# Patient Record
Sex: Female | Born: 1970 | Race: Black or African American | Hispanic: No | Smoking: Never smoker
Health system: Southern US, Community
[De-identification: ages and names within clinical notes are randomized; demographics above are authoritative.]

## PROBLEM LIST (undated history)

## (undated) DIAGNOSIS — N939 Abnormal uterine and vaginal bleeding, unspecified: Secondary | ICD-10-CM

## (undated) DIAGNOSIS — D259 Leiomyoma of uterus, unspecified: Secondary | ICD-10-CM

## (undated) HISTORY — PX: APPENDECTOMY: SHX54

## (undated) HISTORY — PX: TUBAL LIGATION: SHX77

---

## 1999-12-05 ENCOUNTER — Ambulatory Visit (HOSPITAL_COMMUNITY): Admission: RE | Admit: 1999-12-05 | Discharge: 1999-12-05 | Payer: Self-pay | Admitting: *Deleted

## 2000-03-13 ENCOUNTER — Encounter: Admission: RE | Admit: 2000-03-13 | Discharge: 2000-03-13 | Payer: Self-pay | Admitting: Obstetrics

## 2000-04-03 ENCOUNTER — Inpatient Hospital Stay (HOSPITAL_COMMUNITY): Admission: AD | Admit: 2000-04-03 | Discharge: 2000-04-03 | Payer: Self-pay | Admitting: Obstetrics

## 2000-04-03 ENCOUNTER — Encounter: Payer: Self-pay | Admitting: Obstetrics

## 2000-04-08 ENCOUNTER — Encounter (HOSPITAL_COMMUNITY): Admission: RE | Admit: 2000-04-08 | Discharge: 2000-04-12 | Payer: Self-pay | Admitting: Obstetrics & Gynecology

## 2000-04-13 ENCOUNTER — Inpatient Hospital Stay (HOSPITAL_COMMUNITY): Admission: AD | Admit: 2000-04-13 | Discharge: 2000-04-15 | Payer: Self-pay | Admitting: Obstetrics

## 2000-04-13 ENCOUNTER — Encounter (INDEPENDENT_AMBULATORY_CARE_PROVIDER_SITE_OTHER): Payer: Self-pay | Admitting: Specialist

## 2002-08-24 ENCOUNTER — Emergency Department (HOSPITAL_COMMUNITY): Admission: EM | Admit: 2002-08-24 | Discharge: 2002-08-24 | Payer: Self-pay

## 2006-04-28 ENCOUNTER — Emergency Department (HOSPITAL_COMMUNITY): Admission: EM | Admit: 2006-04-28 | Discharge: 2006-04-28 | Payer: Self-pay | Admitting: Emergency Medicine

## 2006-07-18 ENCOUNTER — Emergency Department (HOSPITAL_COMMUNITY): Admission: EM | Admit: 2006-07-18 | Discharge: 2006-07-18 | Payer: Self-pay | Admitting: Emergency Medicine

## 2007-02-23 ENCOUNTER — Emergency Department (HOSPITAL_COMMUNITY): Admission: EM | Admit: 2007-02-23 | Discharge: 2007-02-23 | Payer: Self-pay | Admitting: Emergency Medicine

## 2008-06-11 ENCOUNTER — Emergency Department (HOSPITAL_COMMUNITY): Admission: EM | Admit: 2008-06-11 | Discharge: 2008-06-12 | Payer: Self-pay | Admitting: Emergency Medicine

## 2008-10-16 ENCOUNTER — Emergency Department (HOSPITAL_COMMUNITY): Admission: EM | Admit: 2008-10-16 | Discharge: 2008-10-16 | Payer: Self-pay | Admitting: Emergency Medicine

## 2010-06-19 ENCOUNTER — Encounter: Payer: Self-pay | Admitting: Internal Medicine

## 2010-09-11 LAB — CARBOXYHEMOGLOBIN
Carboxyhemoglobin: 0.8 % (ref 0.5–1.5)
Methemoglobin: 1 % (ref 0.0–1.5)
O2 Saturation: 43.3 %
Total hemoglobin: 9 g/dL — ABNORMAL LOW (ref 12.5–16.0)

## 2010-10-13 NOTE — Op Note (Signed)
[facility]  Patient:    Jacqueline Stanley, Jacqueline Stanley                     MRN: [id number] Proc. Date: 04/14/00 Adm. Date:  [id number] Attending:  Antionette Char                           Operative Report  PREOPERATIVE DIAGNOSIS:       Multiparity, desires permanent sterilization.  POSTOPERATIVE DIAGNOSIS:      Multiparity, desires permanent sterilization.  PROCEDURE:                    Postpartum tubal ligation, modified Pomeroy method.  SURGEON:                      Roseanna Rainbow, M.D.  ANESTHESIA:                   General endotracheal.  COMPLICATIONS:                None.  ESTIMATED BLOOD LOSS:         Less than 30 cc.  FLUIDS:                       Lactated Ringers 500 cc.  INDICATIONS:                  The patient is a 40 year old para 3 status post NSVD who desires permanent sterilization.  The risks, benefits of the procedure were discussed with the patient including the risk of failure of 4-8 per 1000 with an increased risk of an ectopic gestation.  FINDINGS:                     Normal uterus, tubes, and ovaries.  PROCEDURE:                    The patient was taken to the operating room where general anesthesia was induced without difficulty.  A small transverse infraumbilical skin incision was then made with the scalpel.  The incision was carried down through the underlying fascia until the peritoneum was identified and entered.  The peritoneum was noted to be free of any adhesions and the incision was extended with Metzenbaum scissors.  The patients left fallopian tube was then identified, brought to the incision, and grasped with a Babcock clamp.  The tube was then followed out to the fimbria.  The Babcock clamp was then used to grasp the tube approximately 4 cm from the cornual region.  A 3 cm segment of tube was then doubly ligated with free ties with plain gut and excised.  Excellent hemostasis was noted and the tube returned  to the abdomen. The right fallopian tube was manipulated in a similar fashion.  The peritoneum and fascia were then closed in a single layer using 0 Vicryl.  The skin was closed in a subcuticular fashion using 3-0 Vicryl.  The patient tolerated the procedure well.  Sponge, lap, and needle counts were correct x 2.  The patient was taken to the PACU in stable condition.  PATHOLOGY:                    Segments of right and left fallopian tubes. DD:  04/14/00 TD:  04/14/00 Job: [id number]

## 2017-05-30 ENCOUNTER — Other Ambulatory Visit: Payer: Self-pay

## 2017-05-30 ENCOUNTER — Encounter (HOSPITAL_COMMUNITY): Payer: Self-pay | Admitting: Emergency Medicine

## 2017-05-30 ENCOUNTER — Encounter (HOSPITAL_COMMUNITY): Payer: Self-pay

## 2017-05-30 ENCOUNTER — Observation Stay (HOSPITAL_COMMUNITY)
Admission: EM | Admit: 2017-05-30 | Discharge: 2017-05-31 | Disposition: A | Discharge status: Home / Self Care | Payer: Medicaid Other | Attending: Internal Medicine | Admitting: Internal Medicine

## 2017-05-30 ENCOUNTER — Observation Stay (HOSPITAL_COMMUNITY): Payer: Medicaid Other

## 2017-05-30 ENCOUNTER — Ambulatory Visit (HOSPITAL_COMMUNITY)
Admission: EM | Admit: 2017-05-30 | Discharge: 2017-05-30 | Disposition: A | Discharge status: Nursing Home (Includes ICF) | Payer: Self-pay | Attending: Internal Medicine | Admitting: Internal Medicine

## 2017-05-30 DIAGNOSIS — R52 Pain, unspecified: Secondary | ICD-10-CM

## 2017-05-30 DIAGNOSIS — D509 Iron deficiency anemia, unspecified: Principal | ICD-10-CM | POA: Insufficient documentation

## 2017-05-30 DIAGNOSIS — R9389 Abnormal findings on diagnostic imaging of other specified body structures: Secondary | ICD-10-CM | POA: Diagnosis not present

## 2017-05-30 DIAGNOSIS — D649 Anemia, unspecified: Secondary | ICD-10-CM | POA: Diagnosis present

## 2017-05-30 DIAGNOSIS — N921 Excessive and frequent menstruation with irregular cycle: Secondary | ICD-10-CM | POA: Diagnosis not present

## 2017-05-30 DIAGNOSIS — N938 Other specified abnormal uterine and vaginal bleeding: Secondary | ICD-10-CM

## 2017-05-30 DIAGNOSIS — R231 Pallor: Secondary | ICD-10-CM

## 2017-05-30 DIAGNOSIS — R42 Dizziness and giddiness: Secondary | ICD-10-CM | POA: Insufficient documentation

## 2017-05-30 DIAGNOSIS — D251 Intramural leiomyoma of uterus: Secondary | ICD-10-CM | POA: Diagnosis not present

## 2017-05-30 DIAGNOSIS — R531 Weakness: Secondary | ICD-10-CM | POA: Insufficient documentation

## 2017-05-30 HISTORY — DX: Other specified abnormal uterine and vaginal bleeding: N93.8

## 2017-05-30 LAB — COMPREHENSIVE METABOLIC PANEL
ALBUMIN: 3.6 g/dL (ref 3.5–5.0)
ALK PHOS: 43 U/L (ref 38–126)
ALT: 7 U/L — ABNORMAL LOW (ref 14–54)
ANION GAP: 7 (ref 5–15)
AST: 16 U/L (ref 15–41)
BUN: 5 mg/dL — ABNORMAL LOW (ref 6–20)
CO2: 24 mmol/L (ref 22–32)
Calcium: 8.6 mg/dL — ABNORMAL LOW (ref 8.9–10.3)
Chloride: 105 mmol/L (ref 101–111)
Creatinine, Ser: 0.53 mg/dL (ref 0.44–1.00)
GFR calc Af Amer: >60 mL/min (ref >60)
GFR calc non Af Amer: >60 mL/min (ref >60)
GLUCOSE: 97 mg/dL (ref 65–99)
POTASSIUM: 3.5 mmol/L (ref 3.5–5.1)
SODIUM: 136 mmol/L (ref 135–145)
Total Bilirubin: 0.3 mg/dL (ref 0.3–1.2)
Total Protein: 7.2 g/dL (ref 6.5–8.1)

## 2017-05-30 LAB — PREPARE RBC (CROSSMATCH)

## 2017-05-30 LAB — CBC
HCT: 11.2 % — ABNORMAL LOW (ref 36.0–46.0)
Hemoglobin: 2.8 g/dL — CL (ref 12.0–15.0)
MCH: 14.7 pg — ABNORMAL LOW (ref 26.0–34.0)
MCHC: 25 g/dL — AB (ref 30.0–36.0)
MCV: 58.6 fL — ABNORMAL LOW (ref 78.0–100.0)
PLATELETS: 502 10*3/uL — AB (ref 150–400)
RBC: 1.91 MIL/uL — ABNORMAL LOW (ref 3.87–5.11)
RDW: 20.8 % — AB (ref 11.5–15.5)
WBC: 6.3 10*3/uL (ref 4.0–10.5)

## 2017-05-30 LAB — IRON AND TIBC: TIBC: 505 ug/dL — AB (ref 250–450)

## 2017-05-30 LAB — PROTIME-INR
INR: 1.11
PROTHROMBIN TIME: 14.2 s (ref 11.4–15.2)

## 2017-05-30 LAB — FERRITIN

## 2017-05-30 LAB — APTT: aPTT: 20 seconds — ABNORMAL LOW (ref 24–36)

## 2017-05-30 LAB — ABO/RH: ABO/RH(D): B POS

## 2017-05-30 LAB — POC OCCULT BLOOD, ED: Fecal Occult Bld: NEGATIVE

## 2017-05-30 MED ORDER — DOCUSATE SODIUM 100 MG PO CAPS: 100.0000 mg | ORAL_CAPSULE | Freq: Two times a day (BID) | ORAL | Status: DC

## 2017-05-30 MED ORDER — SODIUM CHLORIDE 0.9 % IV BOLUS (SEPSIS)
1000.0000 mL | Freq: Once | INTRAVENOUS | Status: AC
  Administered 2017-05-30: 1000 mL via INTRAVENOUS

## 2017-05-30 MED ORDER — ACETAMINOPHEN 325 MG PO TABS: 650.0000 mg | ORAL_TABLET | Freq: Four times a day (QID) | ORAL | Status: DC | PRN

## 2017-05-30 MED ORDER — ONDANSETRON HCL 4 MG/2ML IJ SOLN: 4.0000 mg | Freq: Four times a day (QID) | INTRAMUSCULAR | Status: DC | PRN

## 2017-05-30 MED ORDER — SODIUM CHLORIDE 0.9 % IV SOLN: 10.0000 mL/h | Freq: Once | INTRAVENOUS | Status: DC

## 2017-05-30 MED ORDER — ONDANSETRON HCL 4 MG PO TABS: 4.0000 mg | ORAL_TABLET | Freq: Four times a day (QID) | ORAL | Status: DC | PRN

## 2017-05-30 MED ORDER — ACETAMINOPHEN 650 MG RE SUPP: 650.0000 mg | Freq: Four times a day (QID) | RECTAL | Status: DC | PRN

## 2017-05-30 NOTE — ED Provider Notes (Signed)
05/30/2017 1:55 PM   DOB: 10/25/1970 / MRN: [id number]  SUBJECTIVE:  Jacqueline Stanley is a 47 y.o. female presenting for weakness that gets worse when she stands up. This started after the cessation of her last menses which she describes as heavy for her.  Interestingly she tells me that she likes to eat a lot of ice but has always been this way.  She denies gastrointestinal pain, GERD, NSAID use. There is a paucity of data in Epic for comparison purposes but labs from 2010 do show a hemoglobin of 9.   She has No Known Allergies.   She  has no past medical history on file.    She  reports that  has never smoked. she has never used smokeless tobacco. She reports that she drinks alcohol. She reports that she does not use drugs. She  has no sexual activity history on file. The patient  has no past surgical history on file.  Her family history is not on file.  Review of Systems  Constitutional: Negative for chills and fever.  HENT: Negative for sore throat.   Respiratory: Negative for cough.   Cardiovascular: Negative for chest pain.  Gastrointestinal: Negative for abdominal pain, blood in stool, constipation, diarrhea, melena, nausea and vomiting.  Genitourinary: Negative for dysuria, flank pain, frequency, hematuria and urgency.  Neurological: Positive for dizziness (only with standing). Negative for tingling, tremors, sensory change, speech change, focal weakness, seizures, loss of consciousness and headaches.    OBJECTIVE:  BP 93/75 (BP Location: Left Arm)   Pulse 97   Temp 97.7 F (36.5 C) (Oral)   LMP 05/21/2017 (Exact Date)   SpO2 100%     Physical Exam  Constitutional: She is oriented to person, place, and time. She is active.  Non-toxic appearance.  HENT:  Mouth/Throat: Uvula is midline, oropharynx is clear and moist and mucous membranes are normal. Mucous membranes are not cyanotic. No oropharyngeal exudate, posterior oropharyngeal edema, posterior oropharyngeal erythema or  tonsillar abscesses.    Eyes: EOM are normal. Pupils are equal, round, and reactive to light.  Cardiovascular: Normal rate, regular rhythm, S1 normal, S2 normal, normal heart sounds and intact distal pulses. Exam reveals no gallop, no friction rub and no decreased pulses.  No murmur heard. Pulmonary/Chest: Effort normal and breath sounds normal. No stridor. No tachypnea. No respiratory distress. She has no wheezes. She has no rales. She exhibits no tenderness.  Abdominal: She exhibits no distension.  Musculoskeletal: Normal range of motion. She exhibits no edema, tenderness or deformity.  Neurological: She is alert and oriented to person, place, and time. She has normal strength and normal reflexes. She is not disoriented. She displays no atrophy. No cranial nerve deficit or sensory deficit. She exhibits normal muscle tone. Coordination and gait normal.  Skin: Skin is warm and dry. No rash noted. She is not diaphoretic. No erythema. There is pallor.     Psychiatric: Her behavior is normal.     No results found for this or any previous visit (from the past 72 hour(s)).  No results found.  ASSESSMENT AND PLAN:  The encounter diagnosis was Pallor. Most likely acute on chronic exacerbated by recent heavy menses.  She is dizzy with standing here and she is very pale. The ISTAT will not read her hemoglobin. CBC is drawn.  She needs to be seen in the ED for stabilization and or potential admission for further work up.  NS drip started here and she will be transported to the  ED at [facility].     The patient is advised to call or return to clinic if she does not see an improvement in symptoms, or to seek the care of the closest emergency department if she worsens with the above plan.   Philis Fendt, MHS, PA-C 05/30/2017 1:55 PM    Tereasa Coop, PA-C 05/30/17 1356

## 2017-05-30 NOTE — ED Triage Notes (Signed)
Pt reports weakness x3 days.  She states when she stands up she feels like she is very tired and just wants to sit down.  She also reports a slight headache.

## 2017-05-30 NOTE — ED Provider Notes (Signed)
[place] EMERGENCY DEPARTMENT Provider Note   CSN: [id number] Arrival date & time: 05/30/17  1414     History   Chief Complaint Chief Complaint  Patient presents with  . Anemia  . Weakness    HPI Jacqueline Stanley is a 47 y.o. female.  HPI   47 year old female with a h/o anemia was sent to the ED for anemia workup after she was seen at urgent care PTA.  Pt presented to urgent care complaining of a 3-day history of generalized weakness, fatigue, dyspnea on exertion, and lightheadedness upon standing.  CBC at [facility] showed hemoglobin of 2.8.  Patient reports her LMP was heavier and lasted longer than usual. It started on 05/21/17 and lasted until 05/29/16. States her periods normally last 5 days, but this period lasted 7 days.  Has had ice cravings over the last few months.  Distant history of anemia and was on iron supplementation during teenage years, none currently.  Denies any epistaxis, hematemesis, bleeding gums, hematuria, or blood in stool. Further denies any fevers, decreased PO intake, SOB at rest, chest pain, nausea vomiting, abdominal pain, diarrhea, constipation or urinary symptoms. Denies any known bleeding disorders.  No heavy use of NSAIDs or EtOH. No known personal or family h/o bleeding disorders.  History reviewed. No pertinent past medical history.  Patient Active Problem List   Diagnosis Date Noted  . Symptomatic anemia 05/30/2017  . Dysfunctional uterine bleeding 05/30/2017    Past Surgical History:  Procedure Laterality Date  . APPENDECTOMY      OB History    No data available       Home Medications    Prior to Admission medications   Not on File    Family History History reviewed. No pertinent family history.  Social History Social History   Tobacco Use  . Smoking status: Never Smoker  . Smokeless tobacco: Never Used  Substance Use Topics  . Alcohol use: Yes    Alcohol/week: 3.0 oz    Types: 5 Cans of beer per week   Frequency: Never    Comment: occasional  . Drug use: No     Allergies   Patient has no known allergies.   Review of Systems Review of Systems  Constitutional: Positive for fatigue. Negative for chills and fever.  HENT: Negative for ear pain, nosebleeds, rhinorrhea, sinus pain and sore throat.   Eyes: Negative for pain and visual disturbance.  Respiratory: Negative for cough.        Dyspnea on exertion, no SOB at rest  Cardiovascular: Negative for chest pain, palpitations and leg swelling.  Gastrointestinal: Negative for abdominal pain, constipation, diarrhea, nausea and vomiting.  Genitourinary: Negative for dysuria and hematuria.  Musculoskeletal: Negative for arthralgias and back pain.  Skin: Negative for color change and rash.  Neurological: Positive for weakness and light-headedness. Negative for seizures and syncope.  All other systems reviewed and are negative.    Physical Exam Updated Vital Signs BP (!) 110/54   Pulse 85   Temp 97.7 F (36.5 C) (Oral)   Resp 18   Ht 5' (1.524 m)   Wt 59 kg (130 lb)   LMP 05/21/2017 (Exact Date)   SpO2 100%   BMI 25.39 kg/m   Physical Exam  Constitutional: She appears well-developed and well-nourished. No distress.  HENT:  Head: Normocephalic and atraumatic.  Mouth/Throat: Oropharynx is clear and moist. No oropharyngeal exudate.  Roof of mouth pale  Eyes: Conjunctivae and EOM are normal. Pupils are  equal, round, and reactive to light. No scleral icterus.  Pale lower eyelids.  Neck: Neck supple.  Cardiovascular: Normal rate, regular rhythm and intact distal pulses. Exam reveals friction rub.  No murmur heard. Pulmonary/Chest: Effort normal and breath sounds normal. No stridor. No respiratory distress. She has no wheezes. She has no rales.  Abdominal: Soft. Bowel sounds are normal. She exhibits no distension. There is no tenderness. There is no guarding.  Genitourinary:  Genitourinary Comments: Rectal exam completed and no  gross blood noted externally. Small amount of stool noted in the rectal vault without evidence of impaction. Stool color light brown with no evidence of gross blood. No fissures or hemorrhoids noted externally. No internal hemorrhoids were appreciated on exam.   Musculoskeletal: She exhibits no edema.  Bilat palms and soles pale  Neurological: She is alert.  Skin: Skin is warm and dry. Capillary refill takes 2 to 3 seconds.  Psychiatric: She has a normal mood and affect.  Nursing note and vitals reviewed.    ED Treatments / Results  Labs (all labs ordered are listed, but only abnormal results are displayed) Labs Reviewed  APTT - Abnormal; Notable for the following components:      Result Value   aPTT 20 (*)    All other components within normal limits  COMPREHENSIVE METABOLIC PANEL - Abnormal; Notable for the following components:   BUN 5 (*)    Calcium 8.6 (*)    ALT 7 (*)    All other components within normal limits  IRON AND TIBC - Abnormal; Notable for the following components:   Iron <5 (*)    TIBC 505 (*)    All other components within normal limits  PROTIME-INR  FERRITIN  POC OCCULT BLOOD, ED  POC URINE PREG, ED  TYPE AND SCREEN  PREPARE RBC (CROSSMATCH)  ABO/RH    EKG  EKG Interpretation None       Radiology No results found.  Procedures Procedures (including critical care time)  Medications Ordered in ED Medications - No data to display   Initial Impression / Assessment and Plan / ED Course  I have reviewed the triage vital signs and the nursing notes.  Pertinent labs & imaging results that were available during my care of the patient were reviewed by me and considered in my medical decision making (see chart for details).   Urgent care contacted me to relay critical lab value, hemoglobin of 2.8.   Discussed case with Dr. Jeanell Sparrow and she agrees to order blood products admit the patient.  Rechecked pt and she is in NAD. Her vital signs are stable.  Discussed her lab results and informed her of the plan for blood transfusion and admission. She is agreeable to the plan and has no further questions.   Dr Jeanell Sparrow patient case with Dr. Lorin Mercy who will accept the patient.  Final Clinical Impressions(s) / ED Diagnoses   Final diagnoses:  Symptomatic anemia   47 y/o F with h/o anemia presented with dyspnea on exertion and worsening fatigue for 3 days. Reported recent prolonged menstrual cycle with increased bleeding. Exam and history suggestive of anemia secondary to blood loss from menses. Hgb resulted at 2.8. Ferritin unable to be calculated due to result being <5. Iron low and TIBC increased. Aptt mildly decreased. Normal PT INR. Blood transfusion ordered and will admit to hospitalist service.   ED Discharge Orders    None       Rodney Booze, [place] 05/30/17 1921  Pattricia Boss, MD 05/31/17 [phone]

## 2017-05-30 NOTE — Significant Event (Signed)
Rapid Response Event Note:  Call Type: Triage, orders for MS  ED RN reviewed patient with me, I reviewed the chart, and I went and saw the patient in ED. When I arrived, patient was receiving first unit of PRBCs. Patient endorses lightheadedness, weakness, and fatigue which started 3 days, patient stated that when she tries to walk or exert herself she feels like she needs to lay down and recover because she has no energy. Skin pale, mucous membranes pale, inner eyelids pale.  SBP in the 110s, HR in the 80, heart/lung sounds normal.   From my clinical and nursing perspective, patient should be monitored on telemetry, given that she will be receiving 4 units PRBCs, I would advise that at the least she be monitored overnight on telemetry.  Patient has not received blood products prior to this admission. Reviewed with the patient blood transfusions reactions symptoms briefly as well since she has not had any blood transfusions.    Recommendation: SDU or at least Telemetry on a PCU  Call Time: 1930, 1947 Start Time: 2011 End Time: 2030

## 2017-05-30 NOTE — ED Triage Notes (Signed)
Pt arrives to ED from urgent care with complaints of weakness x3 days. EMS reports hemoglobin "like 9 from what we were told". Pt placed in position of comfort with bed locked and lowered, call bell in reach.

## 2017-05-30 NOTE — H&P (Signed)
History and Physical    Jacqueline Stanley OXB:[id number] DOB: 11-13-1970 DOA: 05/30/2017  PCP: Nolene Ebbs, MD Consultants:  None Patient coming from:  Home - lives with 2 children; NOK: mother, [phone]  Chief Complaint:  weakness  HPI: Jacqueline Stanley is a 47 y.o. female with no significant medical history significant presenting with weakness.  This is the third day that she feels light headed with standing and weak, like she needs to sit back down immediately after standing up.  She has never felt that way before.  She recently finished her period and it was a little heavier than usual, and she did lie down because she was tired but she wasn't nearly so weak during her menses.  She has had a slight AM headache the last few days, which resolved with a bit more rest.  She could only walk to the bedroom or bath and then would have to sit or lie down, like she had run a long race and needed to catch her breath.  Period usually lasts about 5 days, this time a little longer like maybe 7 days.  Period resolved yesterday.  She does not desire future fertility, had BTL in 2001.  She does eat a lot of ice.  She does note that she was laid off from her job in mid-December.  On further questioning, she was not as productive as they wanted her to.  She reports having to walk a long distance from the parking lot into work and feeling fatigue throughout the day (much more mild than current issue).  She thinks this has been going on for the last 5-6 months.   ED Course:  Urgent Care this AM - pale with tachycardia, ISTAT would not read hgb, sent to ER.  Heavy menses, not actively bleeding.  Hgb 2.8.  VSS.  Ordered transfusions of PRBC x 4 units.    Review of Systems: As per HPI; otherwise review of systems reviewed and negative.   Ambulatory Status:  Ambulates without assistance  History reviewed. No pertinent past medical history.  Past Surgical History:  Procedure Laterality Date  . APPENDECTOMY       Social History   Socioeconomic History  . Marital status: Married    Spouse name: Not on file  . Number of children: Not on file  . Years of education: Not on file  . Highest education level: Not on file  Social Needs  . Financial resource strain: Not on file  . Food insecurity - worry: Not on file  . Food insecurity - inability: Not on file  . Transportation needs - medical: Not on file  . Transportation needs - non-medical: Not on file  Occupational History  . Occupation: unemployed  Tobacco Use  . Smoking status: Never Smoker  . Smokeless tobacco: Never Used  Substance and Sexual Activity  . Alcohol use: Yes    Alcohol/week: 3.0 oz    Types: 5 Cans of beer per week    Frequency: Never    Comment: occasional  . Drug use: No  . Sexual activity: Not on file  Other Topics Concern  . Not on file  Social History Narrative  . Not on file    No Known Allergies  History reviewed. No pertinent family history.  Prior to Admission medications   Not on File    Physical Exam: Vitals:   05/30/17 1418 05/30/17 1426 05/30/17 1445 05/30/17 1715  BP:  (!) 114/44 125/69 (!) 110/54  Pulse:  Marland Kitchen)  104 85 85  Resp:  18    Temp:  97.7 F (36.5 C)    TempSrc:  Oral    SpO2:  100% 100% 100%  Weight: 61.2 kg (135 lb) 59 kg (130 lb)    Height: 5' (1.524 m) 5' (1.524 m)       General:  Appears calm and comfortable and is NAD but is very pale Eyes:  PERRL, EOMI, normal lids, iris; extremely pale conjunctiva ENT:  grossly normal hearing, lips & tongue, mmm; appropriate dentition Neck:  no LAD, masses or thyromegaly Cardiovascular:  RRR, no m/r/g. No LE edema.  Respiratory:   CTA bilaterally with no wheezes/rales/rhonchi.  Normal respiratory effort. Abdomen:  soft, NT, ND, NABS Skin:  no rash or induration seen on limited exam Musculoskeletal:  grossly normal tone BUE/BLE, good ROM, no bony abnormality Psychiatric:  grossly normal mood and affect, speech fluent and  appropriate, AOx3; appropriately emotionally labile when discussing the severity of her illness Neurologic:  CN 2-12 grossly intact, moves all extremities in coordinated fashion, sensation intact   Radiological Exams on Admission: No results found.  EKG: not done   Labs on Admission: I have personally reviewed the available labs and imaging studies at the time of the admission.  Pertinent labs:   Hgb 2.8 Heme negative  Assessment/Plan Active Problems:   Symptomatic anemia   Dysfunctional uterine bleeding   -Patient without known h/o anemia or prior comparison other than in 2010 when Hgb was 9.0 -Presenting with 5-6 months of fatigue and SOB with exertion which acutely worsened this week with cessation of a particularly long and heavy menstrual cycle -Suspect that patient has had chronic anemia that was unrecognized but that her prolonged cycle this time led her to finally become too symptomatic to ignore -Her Hgb was 2.8, which is remarkable - that she is still functional is moreso -MCV 58.6, RDW 20.8 -This appears to be profound iron-deficiency anemia -Heme testing negative -Transfusion of 4 units PRBC initiated in the ER -Patient counseled about short- and long-term risks associated with transfusion and consents to receive blood products. -Will observe on Med Surg overnight -If her Hgb is in the 6-7 range after transfusion, she is likely appropriate for outpatient f/u -I spoke with Dr. Ihor Dow, GYN, who will see the patient next week -Patient will have complete pelvic US including transvaginal probe prior to d/c -Patient will need Megace 40 mg PO BID at the time of discharge -Patient will likely need hysterectomy, although endometrial ablation may be a consideration; this can be further discussed at her f/u appointment.  Her Hgb needs to equilibrate before surgery can be considered.   DVT prophylaxis:  SCDs Code Status:  Full - confirmed with patient Family  Communication: None present Disposition Plan:  Home once clinically improved Consults called: GYN by telephone  Admission status: It is my clinical opinion that referral for OBSERVATION is reasonable and necessary in this patient based on the above information provided. The aforementioned taken together are felt to place the patient at high risk for further clinical deterioration. However it is anticipated that the patient may be medically stable for discharge from the hospital within 24 to 48 hours.     Karmen Bongo MD [facility]  If note is complete, please contact covering daytime or nighttime physician. [email or web address] Password [facility]  05/30/2017, 5:19 PM

## 2017-05-30 NOTE — Discharge Instructions (Signed)
Go to the ED

## 2017-05-30 NOTE — ED Notes (Addendum)
Pt. Ok'd to eat and drink: was given [place] sandwich, coke, graham crackers and peanut butter.

## 2017-05-30 NOTE — ED Notes (Signed)
Hospitalist bedside 

## 2017-05-31 ENCOUNTER — Observation Stay (HOSPITAL_COMMUNITY): Payer: Medicaid Other

## 2017-05-31 DIAGNOSIS — R52 Pain, unspecified: Secondary | ICD-10-CM

## 2017-05-31 LAB — CBC WITH DIFFERENTIAL/PLATELET
BASOS PCT: 1 %
Basophils Absolute: 0.1 10*3/uL (ref 0.0–0.1)
Eosinophils Absolute: 0 10*3/uL (ref 0.0–0.7)
Eosinophils Relative: 0 %
HEMATOCRIT: 25.6 % — AB (ref 36.0–46.0)
HEMOGLOBIN: 7.9 g/dL — AB (ref 12.0–15.0)
LYMPHS ABS: 1.4 10*3/uL (ref 0.7–4.0)
Lymphocytes Relative: 21 %
MCH: 21.4 pg — ABNORMAL LOW (ref 26.0–34.0)
MCHC: 30.9 g/dL (ref 30.0–36.0)
MCV: 69.4 fL — AB (ref 78.0–100.0)
MONOS PCT: 11 %
Monocytes Absolute: 0.7 10*3/uL (ref 0.1–1.0)
Neutro Abs: 4.4 10*3/uL (ref 1.7–7.7)
Neutrophils Relative %: 67 %
Platelets: 341 10*3/uL (ref 150–400)
RBC: 3.69 MIL/uL — ABNORMAL LOW (ref 3.87–5.11)
RDW: 23.5 % — AB (ref 11.5–15.5)
WBC: 6.6 10*3/uL (ref 4.0–10.5)

## 2017-05-31 LAB — MRSA PCR SCREENING: MRSA by PCR: NEGATIVE

## 2017-05-31 LAB — BASIC METABOLIC PANEL
Anion gap: 7 (ref 5–15)
CHLORIDE: 108 mmol/L (ref 101–111)
CO2: 22 mmol/L (ref 22–32)
Calcium: 8.3 mg/dL — ABNORMAL LOW (ref 8.9–10.3)
Creatinine, Ser: 0.53 mg/dL (ref 0.44–1.00)
GFR calc Af Amer: >60 mL/min (ref >60)
GLUCOSE: 90 mg/dL (ref 65–99)
POTASSIUM: 3.6 mmol/L (ref 3.5–5.1)
Sodium: 137 mmol/L (ref 135–145)

## 2017-05-31 LAB — HEMOGLOBIN AND HEMATOCRIT, BLOOD
HCT: 26.5 % — ABNORMAL LOW (ref 36.0–46.0)
Hemoglobin: 8.3 g/dL — ABNORMAL LOW (ref 12.0–15.0)

## 2017-05-31 LAB — POC URINE PREG, ED: PREG TEST UR: NEGATIVE

## 2017-05-31 MED ORDER — SODIUM CHLORIDE 0.9 % IV SOLN
500.0000 mg | Freq: Once | INTRAVENOUS | Status: DC
  Filled 2017-05-31: qty 10

## 2017-05-31 MED ORDER — SODIUM CHLORIDE 0.9 % IV BOLUS (SEPSIS)
1000.0000 mL | Freq: Once | INTRAVENOUS | Status: AC
  Administered 2017-05-31: 1000 mL via INTRAVENOUS

## 2017-05-31 MED ORDER — SODIUM CHLORIDE 0.9 % IV SOLN
25.0000 mg | Freq: Once | INTRAVENOUS | Status: AC
  Administered 2017-05-31: 25 mg via INTRAVENOUS
  Filled 2017-05-31: qty 0.5

## 2017-05-31 MED ORDER — FERROUS SULFATE 325 (65 FE) MG PO TABS: 325.0000 mg | ORAL_TABLET | Freq: Three times a day (TID) | ORAL | 3 refills | Status: DC

## 2017-05-31 MED ORDER — FERROUS SULFATE 325 (65 FE) MG PO TABS
325.0000 mg | ORAL_TABLET | Freq: Three times a day (TID) | ORAL | Status: DC
  Filled 2017-05-31: qty 1

## 2017-05-31 NOTE — ED Notes (Signed)
Dr. Alfredia Ferguson paged to RN Abigail Butts per her request

## 2017-05-31 NOTE — ED Notes (Addendum)
Patient unable to void at this time, Pt. Is aware we need a Urine Preg. Urine cup sitting at bedside.

## 2017-05-31 NOTE — ED Notes (Signed)
At this time patient has pending bed assignment on Med surg bed. Contacted RRN to confirm patient's level of care appropriate. Based on objective data available via chart and this RNs assessment of patient, RRN advises that patient is not appropriate for Med Surg level of care. Spoke with MD Shanon Brow about upgrading level of care and MD advises that patient does not need to be upgraded in spite of hgb level at 2.8 and symptoms. MD Shanon Brow requests that [facility] or RRN contact MD Yates to discuss the matter if they disagree. Provided phone number for MD Yates to [place], [facility].

## 2017-05-31 NOTE — ED Notes (Signed)
Heart Healthy Diet Ordered for Lunch. 

## 2017-05-31 NOTE — ED Notes (Signed)
Admitting paged d/t pt reporting acute onset vaginal bleeding, pt A&O x4, denies SOB & Dizziness

## 2017-05-31 NOTE — ED Notes (Signed)
Sheik, MD returned call and is aware of vaginal bleeding, plan to monitor, repeat hemoglobin once timeframe of blood transfusion is met, and notify if the pt is saturating pads more than 1 an hour

## 2017-06-01 LAB — BPAM RBC
BLOOD PRODUCT EXPIRATION DATE: 201901082359
BLOOD PRODUCT EXPIRATION DATE: 201901272359
Blood Product Expiration Date: 201901292359
Blood Product Expiration Date: 201901302359
ISSUE DATE / TIME: 201901032217
ISSUE DATE / TIME: 201901040433
ISSUE DATE / TIME: 201901031955
ISSUE DATE / TIME: 201901040120
UNIT TYPE AND RH: 7300
UNIT TYPE AND RH: 7300
Unit Type and Rh: 1700
Unit Type and Rh: 7300

## 2017-06-01 LAB — TYPE AND SCREEN
ABO/RH(D): B POS
ANTIBODY SCREEN: NEGATIVE
UNIT DIVISION: 0
UNIT DIVISION: 0
UNIT DIVISION: 0
Unit division: 0

## 2017-06-01 LAB — HIV ANTIBODY (ROUTINE TESTING W REFLEX): HIV Screen 4th Generation wRfx: NONREACTIVE

## 2017-06-05 NOTE — Discharge Summary (Signed)
Physician Discharge Summary  Jacqueline Stanley YSA:[id number] DOB: [birth year] DOA: 05/30/2017  PCP: Nolene Ebbs, MD  Admit date: 05/30/2017 Discharge date: 05/31/2017  Admitted From: Home Disposition: Home  Recommendations for Outpatient Follow-up:  1. Follow up with PCP in 1-2 weeks 2. Follow up with OB-GYN within 1 week  3. Please obtain CMP/CBC, Mag, Phos in one week 4. Please follow up on the following pending results:  Home Health: No Equipment/Devices: None    Discharge Condition: Stable CODE STATUS: FULL CODE Diet recommendation: Heart Healthy Diet  Brief/Interim Summary: Jacqueline Stanley is a 47 y.o. female with no significant medical history significant who waspresenting with weakness.  This is the third day that she feels light headed with standing and weak, like she needs to sit back down immediately after standing up.  She has never felt that way before.  She recently finished her period and it was a little heavier than usual, and she did lie down because she was tired but she wasn't nearly so weak during her menses.  She has had a slight AM headache the last few days, which resolved with a bit more rest.  She could only walk to the bedroom or bath and then would have to sit or lie down, like she had run a long race and needed to catch her breath.  Period usually lasts about 5 days, this time a little longer like maybe 7 days.  Period resolved yesterday.  She does not desire future fertility, had BTL in 2001.  She does eat a lot of ice.  She does note that she was laid off from her job in mid-December.  On further questioning, she was not as productive as they wanted her to.  She reports having to walk a long distance from the parking lot into work and feeling fatigue throughout the day (much more mild than current issue).  She thinks this has been going on for the last 5-6 months. Found to have severe symptomatic anemia likely from menometrorrhagia in the setting of Uterine  Fibroids. She was admitted and transfused 4 units of pRBC's and given IV Iron replacement. Hb/Hct Trended up and case was discussed with GYN who recommended outpatient follow up. Patient was deemed medically stable to D/C Home and follow up with PCP and GYN as an outpatient and she was started on po Iron replacement.    Discharge Diagnoses:  Active Problems:   Symptomatic anemia   Dysfunctional uterine bleeding  Acute Symptomatic Anemia 2/2 Dysfunctional uterine bleeding and Menometrorrhagia from Uterine Fibriod, improved  -Patient without known h/o anemia or prior comparison other than in 2010 when Hgb was 9.0 -Presenting with 5-6 months of fatigue and SOB with exertion which acutely worsened this week with cessation of a particularly long and heavy menstrual cycle -Suspect that patient has had chronic anemia that was unrecognized but that her prolonged cycle this time led her to finally become too symptomatic to ignore -Her Hgb was 2.8, which is remarkable - that she is still functional is more so -MCV 58.6, RDW 20.8 on Admission  -This appears to be profound iron-deficiency anemia -FOBT Negative -Transfusion of 4 units PRBC initiated in the ER -Patient counseled about short- and long-term risks associated with transfusion and consents to receive blood products. -Hgb is in the 6-7 range after transfusion and was  appropriate for outpatient f/u -Dr. Karmen Bongo discussed with Dr. Ihor Dow, GYN, who will see the patient next week -Had complete pelvic US including transvaginal probe  prior to d/c and showed Apparent focal thickening of the endometrial stripe at the fundus. and 1.7 cm posterior intramural uterine body fibroid. Unremarkable ovaries. -Patient will likely need hysterectomy, although endometrial ablation may be a consideration; this can be further discussed at her f/u appointment.  Her Hgb needs to equilibrate before surgery can be considered. -Discussed Case with Dr. Rip Harbour  and patient ok to D/C and follow up with GYN; Appointment for Dr. Ihor Dow to be made -Follow up with PCP and GYN as an outpatient.   Menometrorrhagia  -Had some vaginal bleeding in the hospital that was mild -Discussed case with OB-GYN Dr. Rip Harbour who recommended outpatient follow up withn 1 week  Iron Deficiency Anemia -Hb/Hct on Admission was 2.8/11.2 -Anemia Panel showed Iron <5, TIBC 505; Unable to calculate UIBC, Saturation Ratios, Ferritin  -Given transfusion of 4 units of pRBCs and Hb/HCt improved to 8.3/26.5 -Given IV Iron in Hospital and started on po Replacement -Follow up with PCP and OB/GYN as an outpatient   Focal thickening of the endometrial stripe at the fundus.  -Discussed with Dr. Rip Harbour -Outpatient follow up with GYN and likely will need sampling   Discharge Instructions  Discharge Instructions    Call MD for:  difficulty breathing, headache or visual disturbances   Complete by:  As directed    Call MD for:  extreme fatigue   Complete by:  As directed    Call MD for:  hives   Complete by:  As directed    Call MD for:  persistant dizziness or light-headedness   Complete by:  As directed    Call MD for:  persistant nausea and vomiting   Complete by:  As directed    Call MD for:  redness, tenderness, or signs of infection (pain, swelling, redness, odor or green/yellow discharge around incision site)   Complete by:  As directed    Call MD for:  severe uncontrolled pain   Complete by:  As directed    Call MD for:  temperature >100.4   Complete by:  As directed    Diet - low sodium heart healthy   Complete by:  As directed    Discharge instructions   Complete by:  As directed    Follow up with PCP and OB/GYN within 1 week. Take all medications as prescribed. If symptoms change or worsen please return to the ED for evaluation.   Increase activity slowly   Complete by:  As directed      Allergies as of 05/31/2017   No Known Allergies     Medication  List    TAKE these medications   ferrous sulfate 325 (65 FE) MG tablet Take 1 tablet (325 mg total) by mouth 3 (three) times daily with meals.      Follow-up Information    Nolene Ebbs, MD. Call.   Specialty:  Internal Medicine Why:  Call within 1 week to follow up Contact information: [place] [address] [phone]        Chancy Milroy, MD. Call.   Specialty:  Obstetrics and Gynecology Why:  Follow up within 1 week for suspected Menometorrhagia and Symptomatic Anemia.  Secondary Number to call is [phone] Contact information: [address] [place] [address] [phone]          No Known Allergies  Consultations:  Discussed Case with GYN (Dr. Lorin Mercy discussed with Dr. Ihor Dow, and I discussed with Dr. Rip Harbour)  Procedures/Studies: US Pelvic Complete With Transvaginal  Result  Date: 05/30/2017 CLINICAL DATA:  47 year old female with symptomatic anemia and dysfunctional uterine bleeding. EXAM: TRANSABDOMINAL AND TRANSVAGINAL ULTRASOUND OF PELVIS TECHNIQUE: Both transabdominal and transvaginal ultrasound examinations of the pelvis were performed. Transabdominal technique was performed for global imaging of the pelvis including uterus, ovaries, adnexal regions, and pelvic cul-de-sac. It was necessary to proceed with endovaginal exam following the transabdominal exam to visualize the ovaries and endometrium. COMPARISON:  None FINDINGS: Uterus Measurements: 9.9 x 4.9 x 6 cm and retroflexed. A 1.7 x 1.5 x 1.2 cm posterior intramural uterine body fibroid is noted. Endometrium Thickness: Focally thickened at the fundus with some cystic change, with focal thickening measuring 13 mm. The remainder the endometrial stripe is unremarkable. Right ovary Measurements: 2.4 x 1.2 x 1.0 cm. Normal appearance/no suspicious adnexal mass. Left ovary Measurements: 4.2 x 2 x 1.8 cm. Normal appearance/no suspicious adnexal mass. Other findings A trace amount  of free pelvic fluid is noted. IMPRESSION: 1. Apparent focal thickening of the endometrial stripe at the fundus. Consider further evaluation with sonohysterogram for confirmation prior to hysteroscopy. Endometrial sampling should also be considered if patient is at high risk for endometrial carcinoma. (Ref: Radiological Reasoning: Algorithmic Workup of Abnormal Vaginal Bleeding with Endovaginal Sonography and Sonohysterography. AJR 2008; 465:K35-46) 2. 1.7 cm posterior intramural uterine body fibroid. 3. Unremarkable ovaries. Electronically Signed   By: Margarette Canada M.D.   On: 05/30/2017 19:40   Dg Hip Unilat With Pelvis 2-3 Views Left  Result Date: 05/31/2017 CLINICAL DATA:  Pain EXAM: DG HIP (WITH OR WITHOUT PELVIS) 2-3V LEFT COMPARISON:  None. FINDINGS: Hip joints and SI joints are symmetric and unremarkable. No acute bony abnormality. Specifically, no fracture, subluxation, or dislocation. IMPRESSION: No acute bony abnormality. Electronically Signed   By: Rolm Baptise M.D.   On: 05/31/2017 10:01     Subjective: Seen and examined at bedside and had improved. SOB and lightheaded feeling improved. No nausea or vomiting. Wanting to go home.   Discharge Exam: Vitals:   05/31/17 1100 05/31/17 1253  BP: 132/73   Pulse: 68   Resp: 13   Temp:  98.2 F (36.8 C)  SpO2: 100%    Vitals:   05/31/17 0930 05/31/17 1000 05/31/17 1100 05/31/17 1253  BP: 121/68 122/71 132/73   Pulse: 74 69 68   Resp: 15  13   Temp:    98.2 F (36.8 C)  TempSrc:    Oral  SpO2: 99% 100% 100%   Weight:    56.9 kg (125 lb 8 oz)  Height:    5' (1.524 m)   General: Pt is a thin AAF who alert, awake, not in acute distress; has some conjunctival pallor  Cardiovascular: RRR, S1/S2 +, no rubs, no gallops Respiratory: CTA bilaterally, no wheezing, no rhonchi Abdominal: Soft, NT, ND, bowel sounds + Extremities: no edema, no cyanosis  The results of significant diagnostics from this hospitalization (including imaging,  microbiology, ancillary and laboratory) are listed below for reference.    Microbiology: Recent Results (from the past 240 hour(s))  MRSA PCR Screening     Status: None   Collection Time: 05/31/17  3:16 PM  Result Value Ref Range Status   MRSA by PCR NEGATIVE NEGATIVE Final    Comment:        The GeneXpert MRSA Assay (FDA approved for NASAL specimens only), is one component of a comprehensive MRSA colonization surveillance program. It is not intended to diagnose MRSA infection nor to guide or monitor treatment for MRSA infections.  Labs: BNP (last 3 results) No results for input(s): BNP in the last 8760 hours. Basic Metabolic Panel: Recent Labs  Lab 05/30/17 1520 05/31/17 1008  NA 136 137  K 3.5 3.6  CL 105 108  CO2 24 22  GLUCOSE 97 90  BUN 5* <5*  CREATININE 0.53 0.53  CALCIUM 8.6* 8.3*   Liver Function Tests: Recent Labs  Lab 05/30/17 1520  AST 16  ALT 7*  ALKPHOS 43  BILITOT 0.3  PROT 7.2  ALBUMIN 3.6   No results for input(s): LIPASE, AMYLASE in the last 168 hours. No results for input(s): AMMONIA in the last 168 hours. CBC: Recent Labs  Lab 05/30/17 1315 05/31/17 1008 05/31/17 1423  WBC 6.3 6.6  --   NEUTROABS  --  4.4  --   HGB 2.8* 7.9* 8.3*  HCT 11.2* 25.6* 26.5*  MCV 58.6* 69.4*  --   PLT 502* 341  --    Cardiac Enzymes: No results for input(s): CKTOTAL, CKMB, CKMBINDEX, TROPONINI in the last 168 hours. BNP: Invalid input(s): POCBNP CBG: No results for input(s): GLUCAP in the last 168 hours. D-Dimer No results for input(s): DDIMER in the last 72 hours. Hgb A1c No results for input(s): HGBA1C in the last 72 hours. Lipid Profile No results for input(s): CHOL, HDL, LDLCALC, TRIG, CHOLHDL, LDLDIRECT in the last 72 hours. Thyroid function studies No results for input(s): TSH, T4TOTAL, T3FREE, THYROIDAB in the last 72 hours.  Invalid input(s): FREET3 Anemia work up No results for input(s): VITAMINB12, FOLATE, FERRITIN, TIBC, IRON,  RETICCTPCT in the last 72 hours. Urinalysis No results found for: COLORURINE, APPEARANCEUR, [place], [place], [place], [place], [place], [place], PROTEINUR, UROBILINOGEN, NITRITE, LEUKOCYTESUR Sepsis Labs Invalid input(s): PROCALCITONIN,  WBC,  LACTICIDVEN Microbiology Recent Results (from the past 240 hour(s))  MRSA PCR Screening     Status: None   Collection Time: 05/31/17  3:16 PM  Result Value Ref Range Status   MRSA by PCR NEGATIVE NEGATIVE Final    Comment:        The GeneXpert MRSA Assay (FDA approved for NASAL specimens only), is one component of a comprehensive MRSA colonization surveillance program. It is not intended to diagnose MRSA infection nor to guide or monitor treatment for MRSA infections.    Time coordinating discharge: 35 minutes  SIGNED:  Kerney Elbe, DO [facility] 06/05/2017, 3:05 PM Pager [phone]  If 7PM-7AM, please contact night-coverage [email or web address] Password [email or web address]

## 2017-06-06 ENCOUNTER — Other Ambulatory Visit (HOSPITAL_COMMUNITY)
Admission: RE | Admit: 2017-06-06 | Discharge: 2017-06-06 | Disposition: A | Discharge status: Home / Self Care | Payer: Medicaid Other | Source: Ambulatory Visit | Attending: Obstetrics & Gynecology | Admitting: Obstetrics & Gynecology

## 2017-06-06 ENCOUNTER — Encounter: Payer: Self-pay | Admitting: Obstetrics & Gynecology

## 2017-06-06 ENCOUNTER — Ambulatory Visit (INDEPENDENT_AMBULATORY_CARE_PROVIDER_SITE_OTHER): Payer: Self-pay | Admitting: Obstetrics & Gynecology

## 2017-06-06 VITALS — BP 131/69 | HR 72 | Ht 60.0 in | Wt 130.5 lb

## 2017-06-06 DIAGNOSIS — D649 Anemia, unspecified: Secondary | ICD-10-CM | POA: Insufficient documentation

## 2017-06-06 DIAGNOSIS — N938 Other specified abnormal uterine and vaginal bleeding: Secondary | ICD-10-CM | POA: Diagnosis not present

## 2017-06-06 DIAGNOSIS — Z1151 Encounter for screening for human papillomavirus (HPV): Secondary | ICD-10-CM | POA: Insufficient documentation

## 2017-06-06 DIAGNOSIS — Z3202 Encounter for pregnancy test, result negative: Secondary | ICD-10-CM

## 2017-06-06 LAB — POCT PREGNANCY, URINE: Preg Test, Ur: NEGATIVE

## 2017-06-06 MED ORDER — INTEGRA F 125-1 MG PO CAPS
1.0000 | ORAL_CAPSULE | Freq: Every day | ORAL | 3 refills | Status: DC
Start: 2017-06-06 — End: 2017-10-18

## 2017-06-06 MED ORDER — MEGESTROL ACETATE 40 MG PO TABS
40.0000 mg | ORAL_TABLET | Freq: Every day | ORAL | 3 refills | Status: DC
Start: 2017-06-06 — End: 2017-09-06

## 2017-06-06 NOTE — Patient Instructions (Signed)
Endometrial Biopsy, Care After This sheet gives you information about how to care for yourself after your procedure. Your health care provider may also give you more specific instructions. If you have problems or questions, contact your health care provider. What can I expect after the procedure? After the procedure, it is common to have:  Mild cramping.  A small amount of vaginal bleeding for a few days. This is normal.  Follow these instructions at home:  Take over-the-counter and prescription medicines only as told by your health care provider.  Do not douche, use tampons, or have sexual intercourse until your health care provider approves.  Return to your normal activities as told by your health care provider. Ask your health care provider what activities are safe for you.  Follow instructions from your health care provider about any activity restrictions, such as restrictions on strenuous exercise or heavy lifting. Contact a health care provider if:  You have heavy bleeding, or bleed for longer than 2 days after the procedure.  You have bad smelling discharge from your vagina.  You have a fever or chills.  You have a burning sensation when urinating or you have difficulty urinating.  You have severe pain in your lower abdomen. Get help right away if:  You have severe cramps in your stomach or back.  You pass large blood clots.  Your bleeding increases.  You become weak or light-headed, or you pass out. Summary  After the procedure, it is common to have mild cramping and a small amount of vaginal bleeding for a few days.  Do not douche, use tampons, or have sexual intercourse until your health care provider approves.  Return to your normal activities as told by your health care provider. Ask your health care provider what activities are safe for you. This information is not intended to replace advice given to you by your health care provider. Make sure you discuss any  questions you have with your health care provider. Document Released: 03/04/2013 Document Revised: 05/30/2016 Document Reviewed: 05/30/2016 Elsevier Interactive Patient Education  2017 [place]. Endometrial Ablation Endometrial ablation is a procedure that destroys the thin inner layer of the lining of the uterus (endometrium). This procedure may be done:  To stop heavy periods.  To stop bleeding that is causing anemia.  To control irregular bleeding.  To treat bleeding caused by small tumors (fibroids) in the endometrium.  This procedure is often an alternative to major surgery, such as removal of the uterus and cervix (hysterectomy). As a result of this procedure:  You may not be able to have children. However, if you are premenopausal (you have not gone through menopause): ? You may still have a small chance of getting pregnant. ? You will need to use a reliable method of birth control after the procedure to prevent pregnancy.  You may stop having a menstrual period, or you may have only a small amount of bleeding during your period. Menstruation may return several years after the procedure.  Tell a health care provider about:  Any allergies you have.  All medicines you are taking, including vitamins, herbs, eye drops, creams, and over-the-counter medicines.  Any problems you or family members have had with the use of anesthetic medicines.  Any blood disorders you have.  Any surgeries you have had.  Any medical conditions you have. What are the risks? Generally, this is a safe procedure. However, problems may occur, including:  A hole (perforation) in the uterus or bowel.  Infection of the uterus, bladder, or vagina.  Bleeding.  Damage to other structures or organs.  An air bubble in the lung (air embolus).  Problems with pregnancy after the procedure.  Failure of the procedure.  Decreased ability to diagnose cancer in the endometrium.  What happens  before the procedure?  You will have tests of your endometrium to make sure there are no pre-cancerous cells or cancer cells present.  You may have an ultrasound of the uterus.  You may be given medicines to thin the endometrium.  Ask your health care provider about: ? Changing or stopping your regular medicines. This is especially important if you take diabetes medicines or blood thinners. ? Taking medicines such as aspirin and ibuprofen. These medicines can thin your blood. Do not take these medicines before your procedure if your doctor tells you not to.  Plan to have someone take you home from the hospital or clinic. What happens during the procedure?  You will lie on an exam table with your feet and legs supported as in a pelvic exam.  To lower your risk of infection: ? Your health care team will wash or sanitize their hands and put on germ-free (sterile) gloves. ? Your genital area will be washed with soap.  An IV tube will be inserted into one of your veins.  You will be given a medicine to help you relax (sedative).  A surgical instrument with a light and camera (resectoscope) will be inserted into your vagina and moved into your uterus. This allows your surgeon to see inside your uterus.  Endometrial tissue will be removed using one of the following methods: ? Radiofrequency. This method uses a radiofrequency-alternating electric current to remove the endometrium. ? Cryotherapy. This method uses extreme cold to freeze the endometrium. ? Heated-free liquid. This method uses a heated saltwater (saline) solution to remove the endometrium. ? Microwave. This method uses high-energy microwaves to heat up the endometrium and remove it. ? Thermal balloon. This method involves inserting a catheter with a balloon tip into the uterus. The balloon tip is filled with heated fluid to remove the endometrium. The procedure may vary among health care providers and hospitals. What happens  after the procedure?  Your blood pressure, heart rate, breathing rate, and blood oxygen level will be monitored until the medicines you were given have worn off.  As tissue healing occurs, you may notice vaginal bleeding for 4-6 weeks after the procedure. You may also experience: ? Cramps. ? Thin, watery vaginal discharge that is light pink or brown in color. ? A need to urinate more frequently than usual. ? Nausea.  Do not drive for 24 hours if you were given a sedative.  Do not have sex or insert anything into your vagina until your health care provider approves. Summary  Endometrial ablation is done to treat the many causes of heavy menstrual bleeding.  The procedure may be done only after medications have been tried to control the bleeding.  Plan to have someone take you home from the hospital or clinic. This information is not intended to replace advice given to you by your health care provider. Make sure you discuss any questions you have with your health care provider. Document Released: 03/23/2004 Document Revised: 05/31/2016 Document Reviewed: 05/31/2016 Elsevier Interactive Patient Education  2017 [employer].

## 2017-06-06 NOTE — Progress Notes (Signed)
Subjective:     Jacqueline Stanley is a 47 y.o. female here for eval of AUB. She was admitted to [facility] host for severe sx anemia.  She is s/p a transfusion of ~4 units of PRBCs. : She reports heavy bleeding 2x/month. Her US showed thickened endometrium.     Gynecologic History Patient's last menstrual period was 05/29/2017 (exact date). Contraception: tubal ligation Last Pap: unk. Results were: normal Last mammogram: > 2 years prev. Results were: normal  Obstetric History G3P3  Review of Systems Pertinent items are noted in HPI.    Objective:  BP 131/69   Pulse 72   Ht 5' (1.524 m)   Wt 130 lb 8 oz (59.2 kg)   LMP 05/29/2017 (Exact Date)   BMI 25.49 kg/m   CONSTITUTIONAL: Well-developed, well-nourished female in no acute distress.  HENT:  Normocephalic, atraumatic EYES: Conjunctivae and EOM are normal. No scleral icterus.  NECK: Normal range of motion SKIN: Skin is warm and dry. No rash noted. Not diaphoretic.No pallor. [place]: Alert and oriented to person, place, and time. Normal coordination.  Lungs: CTa CV; RRR Abd: soft, NT, ND Pelvic: small moile uterus  The indications for endometrial biopsy were reviewed.   Risks of the biopsy including cramping, bleeding, infection, uterine perforation, inadequate specimen and need for additional procedures  were discussed. The patient states she understands and agrees to undergo procedure today. Consent was signed. Time out was performed. Urine HCG was negative. A sterile speculum was placed in the patient's vagina and the cervix was prepped with Betadine. A single-toothed tenaculum was placed on the anterior lip of the cervix to stabilize it. The 3 mm pipelle was introduced into the endometrial cavity without difficulty to a depth of 9cm, and a moderate amount of tissue was obtained and sent to pathology. The instruments were removed from the patient's vagina. Minimal bleeding from the cervix was noted. The patient tolerated the procedure  well. Routine post-procedure instructions were given to the patient. The patient will follow up to review the results and for further management.       CBC CBC Latest Ref Rng & Units 05/31/2017 05/31/2017 05/30/2017  WBC 4.0 - 10.5 K/uL - 6.6 6.3  Hemoglobin 12.0 - 15.0 g/dL 8.3(L) 7.9(L) 2.8(LL)  Hematocrit 36.0 - 46.0 % 26.5(L) 25.6(L) 11.2(L)  Platelets 150 - 400 K/uL - 341 502(H)   05/30/2016 CLINICAL DATA:  47 year old female with symptomatic anemia and dysfunctional uterine bleeding.  EXAM: TRANSABDOMINAL AND TRANSVAGINAL ULTRASOUND OF PELVIS  TECHNIQUE: Both transabdominal and transvaginal ultrasound examinations of the pelvis were performed. Transabdominal technique was performed for global imaging of the pelvis including uterus, ovaries, adnexal regions, and pelvic cul-de-sac. It was necessary to proceed with endovaginal exam following the transabdominal exam to visualize the ovaries and endometrium.  COMPARISON:  None  FINDINGS: Uterus  Measurements: 9.9 x 4.9 x 6 cm and retroflexed. A 1.7 x 1.5 x 1.2 cm posterior intramural uterine body fibroid is noted.  Endometrium  Thickness: Focally thickened at the fundus with some cystic change, with focal thickening measuring 13 mm. The remainder the endometrial stripe is unremarkable.  Right ovary  Measurements: 2.4 x 1.2 x 1.0 cm. Normal appearance/no suspicious adnexal mass.  Left ovary  Measurements: 4.2 x 2 x 1.8 cm. Normal appearance/no suspicious adnexal mass.  Other findings  A trace amount of free pelvic fluid is noted.  IMPRESSION: 1. Apparent focal thickening of the endometrial stripe at the fundus. Consider further evaluation with sonohysterogram for confirmation  prior to hysteroscopy. Endometrial sampling should also be considered if patient is at high risk for endometrial carcinoma. (Ref: Radiological Reasoning: Algorithmic Workup of Abnormal Vaginal Bleeding with Endovaginal Sonography  and Sonohysterography. AJR 2008; 161:W96-04) 2. 1.7 cm posterior intramural uterine body fibroid. 3. Unremarkable ovaries.   Assessment:   Patient Active Problem List   Diagnosis Date Noted  . Symptomatic anemia 05/30/2017  . Dysfunctional uterine bleeding 05/30/2017     Plan:    f/u PAP with hrHPV F/u results of Endo bx Need to complete paperwork form screening mammogram  Megace 40mg  daily Integra 1 po q day I reviewed with her endometrial ablation vs TVH. F/u in 2 weeks or sooner prn Pt has completed the financial aid paperwork when she was at [facility].   Total face-to-face time with patient was 35 min.  Greater than 50% was spent in counseling and coordination of care with the patient.    Anice Wilshire L. Harraway-Smith, M.D., [name]

## 2017-06-10 LAB — CYTOLOGY - PAP
Diagnosis: NEGATIVE
HPV: NOT DETECTED

## 2017-06-11 ENCOUNTER — Encounter: Payer: Self-pay | Admitting: *Deleted

## 2017-06-18 ENCOUNTER — Other Ambulatory Visit: Payer: Self-pay | Admitting: Obstetrics & Gynecology

## 2017-06-18 DIAGNOSIS — Z1231 Encounter for screening mammogram for malignant neoplasm of breast: Secondary | ICD-10-CM

## 2017-06-21 ENCOUNTER — Encounter: Payer: Self-pay | Admitting: Obstetrics & Gynecology

## 2017-06-21 ENCOUNTER — Ambulatory Visit (INDEPENDENT_AMBULATORY_CARE_PROVIDER_SITE_OTHER): Payer: Self-pay | Admitting: Obstetrics & Gynecology

## 2017-06-21 VITALS — BP 114/68 | HR 79 | Wt 132.4 lb

## 2017-06-21 DIAGNOSIS — N939 Abnormal uterine and vaginal bleeding, unspecified: Secondary | ICD-10-CM

## 2017-06-21 NOTE — Progress Notes (Signed)
History:  47 y.o. No obstetric history on file. here today for f/u of AUB and decision for surgery. Pt reports no bleeding since starting the Megace. She is interested in definitve surgery.   The following portions of the patient's history were reviewed and updated as appropriate: allergies, current medications, past family history, past medical history, past social history, past surgical history and problem list.  Review of Systems:  Pertinent items are noted in HPI.   Objective:  Physical Exam Blood pressure 114/68, pulse 79, weight 132 lb 6.4 oz (60.1 kg), last menstrual period 05/31/2017.  CONSTITUTIONAL: Well-developed, well-nourished female in no acute distress.  HENT:  Normocephalic, atraumatic EYES: Conjunctivae and EOM are normal. No scleral icterus.  NECK: Normal range of motion SKIN: Skin is warm and dry. No rash noted. Not diaphoretic.No pallor. [place]: Alert and oriented to person, place, and time. Normal coordination.   Labs and Imaging US Pelvic Complete With Transvaginal  Result Date: 05/30/2017 CLINICAL DATA:  47 year old female with symptomatic anemia and dysfunctional uterine bleeding. EXAM: TRANSABDOMINAL AND TRANSVAGINAL ULTRASOUND OF PELVIS TECHNIQUE: Both transabdominal and transvaginal ultrasound examinations of the pelvis were performed. Transabdominal technique was performed for global imaging of the pelvis including uterus, ovaries, adnexal regions, and pelvic cul-de-sac. It was necessary to proceed with endovaginal exam following the transabdominal exam to visualize the ovaries and endometrium. COMPARISON:  None FINDINGS: Uterus Measurements: 9.9 x 4.9 x 6 cm and retroflexed. A 1.7 x 1.5 x 1.2 cm posterior intramural uterine body fibroid is noted. Endometrium Thickness: Focally thickened at the fundus with some cystic change, with focal thickening measuring 13 mm. The remainder the endometrial stripe is unremarkable. Right ovary Measurements: 2.4 x 1.2 x 1.0 cm.  Normal appearance/no suspicious adnexal mass. Left ovary Measurements: 4.2 x 2 x 1.8 cm. Normal appearance/no suspicious adnexal mass. Other findings A trace amount of free pelvic fluid is noted. IMPRESSION: 1. Apparent focal thickening of the endometrial stripe at the fundus. Consider further evaluation with sonohysterogram for confirmation prior to hysteroscopy. Endometrial sampling should also be considered if patient is at high risk for endometrial carcinoma. (Ref: Radiological Reasoning: Algorithmic Workup of Abnormal Vaginal Bleeding with Endovaginal Sonography and Sonohysterography. AJR 2008; 428:J68-11) 2. 1.7 cm posterior intramural uterine body fibroid. 3. Unremarkable ovaries. Electronically Signed   By: Margarette Canada M.D.   On: 05/30/2017 19:40   Dg Hip Unilat With Pelvis 2-3 Views Left  Result Date: 05/31/2017 CLINICAL DATA:  Pain EXAM: DG HIP (WITH OR WITHOUT PELVIS) 2-3V LEFT COMPARISON:  None. FINDINGS: Hip joints and SI joints are symmetric and unremarkable. No acute bony abnormality. Specifically, no fracture, subluxation, or dislocation. IMPRESSION: No acute bony abnormality. Electronically Signed   By: Rolm Baptise M.D.   On: 05/31/2017 10:01   06/06/2017 Diagnosis Endometrium, biopsy - SCANTY PROLIFERATIVE ENDOMETRIUM, BENIGN ENDOCERVIX AND BLOOD. - NO HYPERPLASIA OR MALIGNANCY. Assessment & Plan:  AUB- s/p s/p transfusion of 4 units of PRBCs.   I reviewed the surg path from her endo biopsy  I have reviewed with pt the tx options including TVH and hysteroscopic resection of fibroid with endometrial ablation. Pt wants conservative option.    Patient desires surgical management with Hysteroscopy with resection of fibroids and endometrial ablation. The risks of surgery were discussed in detail with the patient including but not limited to: bleeding which may require transfusion or reoperation; infection which may require prolonged hospitalization or re-hospitalization and antibiotic  therapy; injury to bowel, bladder, ureters and major vessels or other surrounding  organs; need for additional procedures including laparotomy; thromboembolic phenomenon, incisional problems and other postoperative or anesthesia complications.  Patient was told that the likelihood that her condition and symptoms will be treated effectively with this surgical management was very high; the postoperative expectations were also discussed in detail. The patient also understands the alternative treatment options which were discussed in full. All questions were answered.  She was told that she will be contacted by our surgical scheduler regarding the time and date of her surgery; routine preoperative instructions of having nothing to eat or drink after midnight on the day prior to surgery and also coming to the hospital 1 1/2 hours prior to her time of surgery were also emphasized.  She was told she may be called for a preoperative appointment about a week prior to surgery and will be given further preoperative instructions at that visit. Printed patient education handouts about the procedure were given to the patient to review at home.  Total face-to-face time with patient was 15 min.  Greater than 50% was spent in counseling and coordination of care with the patient.    Isatu Macinnes L. Harraway-Smith, M.D., [name]

## 2017-06-21 NOTE — Patient Instructions (Signed)
Endometrial Ablation Endometrial ablation is a procedure that destroys the thin inner layer of the lining of the uterus (endometrium). This procedure may be done:  To stop heavy periods.  To stop bleeding that is causing anemia.  To control irregular bleeding.  To treat bleeding caused by small tumors (fibroids) in the endometrium.  This procedure is often an alternative to major surgery, such as removal of the uterus and cervix (hysterectomy). As a result of this procedure:  You may not be able to have children. However, if you are premenopausal (you have not gone through menopause): ? You may still have a small chance of getting pregnant. ? You will need to use a reliable method of birth control after the procedure to prevent pregnancy.  You may stop having a menstrual period, or you may have only a small amount of bleeding during your period. Menstruation may return several years after the procedure.  Tell a health care provider about:  Any allergies you have.  All medicines you are taking, including vitamins, herbs, eye drops, creams, and over-the-counter medicines.  Any problems you or family members have had with the use of anesthetic medicines.  Any blood disorders you have.  Any surgeries you have had.  Any medical conditions you have. What are the risks? Generally, this is a safe procedure. However, problems may occur, including:  A hole (perforation) in the uterus or bowel.  Infection of the uterus, bladder, or vagina.  Bleeding.  Damage to other structures or organs.  An air bubble in the lung (air embolus).  Problems with pregnancy after the procedure.  Failure of the procedure.  Decreased ability to diagnose cancer in the endometrium.  What happens before the procedure?  You will have tests of your endometrium to make sure there are no pre-cancerous cells or cancer cells present.  You may have an ultrasound of the uterus.  You may be given  medicines to thin the endometrium.  Ask your health care provider about: ? Changing or stopping your regular medicines. This is especially important if you take diabetes medicines or blood thinners. ? Taking medicines such as aspirin and ibuprofen. These medicines can thin your blood. Do not take these medicines before your procedure if your doctor tells you not to.  Plan to have someone take you home from the hospital or clinic. What happens during the procedure?  You will lie on an exam table with your feet and legs supported as in a pelvic exam.  To lower your risk of infection: ? Your health care team will wash or sanitize their hands and put on germ-free (sterile) gloves. ? Your genital area will be washed with soap.  An IV tube will be inserted into one of your veins.  You will be given a medicine to help you relax (sedative).  A surgical instrument with a light and camera (resectoscope) will be inserted into your vagina and moved into your uterus. This allows your surgeon to see inside your uterus.  Endometrial tissue will be removed using one of the following methods: ? Radiofrequency. This method uses a radiofrequency-alternating electric current to remove the endometrium. ? Cryotherapy. This method uses extreme cold to freeze the endometrium. ? Heated-free liquid. This method uses a heated saltwater (saline) solution to remove the endometrium. ? Microwave. This method uses high-energy microwaves to heat up the endometrium and remove it. ? Thermal balloon. This method involves inserting a catheter with a balloon tip into the uterus. The balloon tip is   filled with heated fluid to remove the endometrium. The procedure may vary among health care providers and hospitals. What happens after the procedure?  Your blood pressure, heart rate, breathing rate, and blood oxygen level will be monitored until the medicines you were given have worn off.  As tissue healing occurs, you may  notice vaginal bleeding for 4-6 weeks after the procedure. You may also experience: ? Cramps. ? Thin, watery vaginal discharge that is light pink or brown in color. ? A need to urinate more frequently than usual. ? Nausea.  Do not drive for 24 hours if you were given a sedative.  Do not have sex or insert anything into your vagina until your health care provider approves. Summary  Endometrial ablation is done to treat the many causes of heavy menstrual bleeding.  The procedure may be done only after medications have been tried to control the bleeding.  Plan to have someone take you home from the hospital or clinic. This information is not intended to replace advice given to you by your health care provider. Make sure you discuss any questions you have with your health care provider. Document Released: 03/23/2004 Document Revised: 05/31/2016 Document Reviewed: 05/31/2016 Elsevier Interactive Patient Education  2017 [employer]  

## 2017-06-24 ENCOUNTER — Encounter (HOSPITAL_COMMUNITY): Payer: Self-pay

## 2017-07-04 DIAGNOSIS — D219 Benign neoplasm of connective and other soft tissue, unspecified: Secondary | ICD-10-CM

## 2017-07-04 DIAGNOSIS — N736 Female pelvic peritoneal adhesions (postinfective): Secondary | ICD-10-CM

## 2017-07-04 DIAGNOSIS — N939 Abnormal uterine and vaginal bleeding, unspecified: Secondary | ICD-10-CM

## 2017-07-22 ENCOUNTER — Encounter (HOSPITAL_BASED_OUTPATIENT_CLINIC_OR_DEPARTMENT_OTHER): Payer: Self-pay | Admitting: *Deleted

## 2017-07-24 ENCOUNTER — Encounter (HOSPITAL_BASED_OUTPATIENT_CLINIC_OR_DEPARTMENT_OTHER): Payer: Self-pay | Admitting: *Deleted

## 2017-07-24 ENCOUNTER — Other Ambulatory Visit: Payer: Self-pay

## 2017-07-24 ENCOUNTER — Inpatient Hospital Stay (HOSPITAL_COMMUNITY): Admission: RE | Admit: 2017-07-24 | Payer: Medicaid Other | Source: Ambulatory Visit

## 2017-07-24 ENCOUNTER — Encounter (HOSPITAL_COMMUNITY)
Admission: RE | Admit: 2017-07-24 | Discharge: 2017-07-24 | Disposition: A | Discharge status: Home / Self Care | Payer: Medicaid Other | Source: Ambulatory Visit | Attending: Obstetrics & Gynecology | Admitting: Obstetrics & Gynecology

## 2017-07-24 DIAGNOSIS — D259 Leiomyoma of uterus, unspecified: Secondary | ICD-10-CM | POA: Diagnosis not present

## 2017-07-24 DIAGNOSIS — N840 Polyp of corpus uteri: Secondary | ICD-10-CM | POA: Diagnosis not present

## 2017-07-24 DIAGNOSIS — N938 Other specified abnormal uterine and vaginal bleeding: Secondary | ICD-10-CM | POA: Diagnosis not present

## 2017-07-24 LAB — CBC
HCT: 35.4 % — ABNORMAL LOW (ref 36.0–46.0)
Hemoglobin: 11.5 g/dL — ABNORMAL LOW (ref 12.0–15.0)
MCH: 27.8 pg (ref 26.0–34.0)
MCHC: 32.5 g/dL (ref 30.0–36.0)
MCV: 85.5 fL (ref 78.0–100.0)
Platelets: 325 10*3/uL (ref 150–400)
RBC: 4.14 MIL/uL (ref 3.87–5.11)
RDW: 24 % — ABNORMAL HIGH (ref 11.5–15.5)
WBC: 5.9 10*3/uL (ref 4.0–10.5)

## 2017-07-24 NOTE — Progress Notes (Addendum)
SPOKE W/ PT FACE TO FACE FOR PRE-OP INTERVIEW.  NPO AFTER MN.  ARRIVE AT 0830.  NEEDS URINE PREG.  CBC DONE TODAY .

## 2017-07-24 NOTE — Patient Instructions (Signed)
                Mayukha Symmonds Burkel  07/24/2017   Your procedure is scheduled on:   Tuesday, 08-02-2017 at 1030  Report to [facility], [place].  Report to admitting at AM   1030    Call this number if you have problems the morning of surgery [phone]  Remember: Do not eat food or drink liquids :After Midnight.     Take these medicines the morning of surgery with A SIP OF WATER: none DO NOT TAKE ANY DIABETIC MEDICATIONS DAY OF YOUR SURGERY                               You may not have any metal on your body including hair pins and              piercings  Do not wear jewelry, make-up, lotions, powders or perfumes, deodorant             Do not wear nail polish.  Do not shave  48 hours prior to surgery.              Men may shave face and neck.   Do not bring valuables to the hospital. [place].  Contacts, dentures or bridgework may not be worn into surgery.  Leave suitcase in the car. After surgery it may be brought to your room.     Patients discharged the day of surgery will not be allowed to drive home.  Name and phone number of your driver:  Special Instructions: N/A              Please read over the following fact sheets you were given: _____________________________________________________________________

## 2017-07-30 ENCOUNTER — Encounter (HOSPITAL_BASED_OUTPATIENT_CLINIC_OR_DEPARTMENT_OTHER): Payer: Self-pay | Admitting: Certified Registered Nurse Anesthetist

## 2017-07-30 ENCOUNTER — Ambulatory Visit (HOSPITAL_COMMUNITY)
Admission: RE | Admit: 2017-07-30 | Discharge: 2017-07-30 | Disposition: A | Discharge status: Home / Self Care | Payer: Medicaid Other | Source: Ambulatory Visit | Attending: Obstetrics & Gynecology | Admitting: Obstetrics & Gynecology

## 2017-07-30 ENCOUNTER — Ambulatory Visit (HOSPITAL_BASED_OUTPATIENT_CLINIC_OR_DEPARTMENT_OTHER): Payer: Medicaid Other | Admitting: Certified Registered Nurse Anesthetist

## 2017-07-30 ENCOUNTER — Encounter (HOSPITAL_BASED_OUTPATIENT_CLINIC_OR_DEPARTMENT_OTHER)
Admission: RE | Disposition: A | Discharge status: Home / Self Care | Payer: Self-pay | Source: Ambulatory Visit | Attending: Obstetrics & Gynecology

## 2017-07-30 DIAGNOSIS — N939 Abnormal uterine and vaginal bleeding, unspecified: Secondary | ICD-10-CM

## 2017-07-30 DIAGNOSIS — D259 Leiomyoma of uterus, unspecified: Secondary | ICD-10-CM | POA: Diagnosis not present

## 2017-07-30 DIAGNOSIS — N736 Female pelvic peritoneal adhesions (postinfective): Secondary | ICD-10-CM

## 2017-07-30 DIAGNOSIS — N840 Polyp of corpus uteri: Secondary | ICD-10-CM | POA: Diagnosis not present

## 2017-07-30 DIAGNOSIS — N938 Other specified abnormal uterine and vaginal bleeding: Secondary | ICD-10-CM | POA: Diagnosis not present

## 2017-07-30 DIAGNOSIS — D219 Benign neoplasm of connective and other soft tissue, unspecified: Secondary | ICD-10-CM

## 2017-07-30 HISTORY — DX: Abnormal uterine and vaginal bleeding, unspecified: N93.9

## 2017-07-30 HISTORY — PX: DILITATION & CURRETTAGE/HYSTROSCOPY WITH NOVASURE ABLATION: SHX5568

## 2017-07-30 HISTORY — DX: Leiomyoma of uterus, unspecified: D25.9

## 2017-07-30 LAB — POCT PREGNANCY, URINE: Preg Test, Ur: NEGATIVE

## 2017-07-30 SURGERY — DILATATION & CURETTAGE/HYSTEROSCOPY WITH NOVASURE ABLATION
Anesthesia: General | Site: Uterus

## 2017-07-30 MED ORDER — LACTATED RINGERS IV SOLN
INTRAVENOUS | Status: DC
  Administered 2017-07-30 (×2): via INTRAVENOUS
  Filled 2017-07-30: qty 1000

## 2017-07-30 MED ORDER — MIDAZOLAM HCL 2 MG/2ML IJ SOLN
INTRAMUSCULAR | Status: AC
  Filled 2017-07-30: qty 2

## 2017-07-30 MED ORDER — EPHEDRINE 5 MG/ML INJ
INTRAVENOUS | Status: AC
  Filled 2017-07-30: qty 10

## 2017-07-30 MED ORDER — IBUPROFEN 600 MG PO TABS: 600.0000 mg | ORAL_TABLET | Freq: Four times a day (QID) | ORAL | 0 refills | Status: DC | PRN

## 2017-07-30 MED ORDER — KETOROLAC TROMETHAMINE 30 MG/ML IJ SOLN
INTRAMUSCULAR | Status: DC | PRN
Start: 2017-07-30 — End: 2017-07-30
  Administered 2017-07-30: 30 mg via INTRAVENOUS

## 2017-07-30 MED ORDER — DEXAMETHASONE SODIUM PHOSPHATE 4 MG/ML IJ SOLN
INTRAMUSCULAR | Status: DC | PRN
  Administered 2017-07-30: 5 mg via INTRAVENOUS

## 2017-07-30 MED ORDER — PROPOFOL 10 MG/ML IV BOLUS
INTRAVENOUS | Status: AC
  Filled 2017-07-30: qty 20

## 2017-07-30 MED ORDER — KETOROLAC TROMETHAMINE 30 MG/ML IJ SOLN
30.0000 mg | Freq: Once | INTRAMUSCULAR | Status: DC | PRN
  Filled 2017-07-30: qty 1

## 2017-07-30 MED ORDER — PROMETHAZINE HCL 25 MG/ML IJ SOLN
6.2500 mg | INTRAMUSCULAR | Status: DC | PRN
  Filled 2017-07-30: qty 1

## 2017-07-30 MED ORDER — FENTANYL CITRATE (PF) 100 MCG/2ML IJ SOLN
INTRAMUSCULAR | Status: DC | PRN
  Administered 2017-07-30: 25 ug via INTRAVENOUS
  Administered 2017-07-30 (×2): 50 ug via INTRAVENOUS

## 2017-07-30 MED ORDER — DEXAMETHASONE SODIUM PHOSPHATE 10 MG/ML IJ SOLN
INTRAMUSCULAR | Status: AC
  Filled 2017-07-30: qty 1

## 2017-07-30 MED ORDER — ONDANSETRON HCL 4 MG/2ML IJ SOLN
INTRAMUSCULAR | Status: DC | PRN
  Administered 2017-07-30: 4 mg via INTRAVENOUS

## 2017-07-30 MED ORDER — KETOROLAC TROMETHAMINE 30 MG/ML IJ SOLN
INTRAMUSCULAR | Status: AC
  Filled 2017-07-30: qty 1

## 2017-07-30 MED ORDER — PROPOFOL 10 MG/ML IV BOLUS
INTRAVENOUS | Status: DC | PRN
  Administered 2017-07-30: 200 mg via INTRAVENOUS
  Administered 2017-07-30: 30 mg via INTRAVENOUS

## 2017-07-30 MED ORDER — EPHEDRINE SULFATE 50 MG/ML IJ SOLN
INTRAMUSCULAR | Status: DC | PRN
  Administered 2017-07-30: 10 mg via INTRAVENOUS

## 2017-07-30 MED ORDER — MIDAZOLAM HCL 5 MG/5ML IJ SOLN
INTRAMUSCULAR | Status: DC | PRN
  Administered 2017-07-30: 2 mg via INTRAVENOUS

## 2017-07-30 MED ORDER — LIDOCAINE 2% (20 MG/ML) 5 ML SYRINGE
INTRAMUSCULAR | Status: AC
  Filled 2017-07-30: qty 5

## 2017-07-30 MED ORDER — SODIUM CHLORIDE 0.9 % IR SOLN
Status: DC | PRN
  Administered 2017-07-30: 3000 mL

## 2017-07-30 MED ORDER — FENTANYL CITRATE (PF) 100 MCG/2ML IJ SOLN
INTRAMUSCULAR | Status: AC
  Filled 2017-07-30: qty 2

## 2017-07-30 MED ORDER — LACTATED RINGERS IV SOLN
INTRAVENOUS | Status: DC
  Administered 2017-07-30 (×2): via INTRAVENOUS
  Filled 2017-07-30: qty 1000

## 2017-07-30 MED ORDER — BUPIVACAINE HCL (PF) 0.5 % IJ SOLN
INTRAMUSCULAR | Status: DC | PRN
  Administered 2017-07-30: 20 mL

## 2017-07-30 MED ORDER — FENTANYL CITRATE (PF) 100 MCG/2ML IJ SOLN
25.0000 ug | INTRAMUSCULAR | Status: DC | PRN
  Filled 2017-07-30: qty 1

## 2017-07-30 MED ORDER — ONDANSETRON HCL 4 MG/2ML IJ SOLN
INTRAMUSCULAR | Status: AC
  Filled 2017-07-30: qty 2

## 2017-07-30 MED ORDER — LIDOCAINE HCL (CARDIAC) 20 MG/ML IV SOLN
INTRAVENOUS | Status: DC | PRN
  Administered 2017-07-30: 50 mg via INTRAVENOUS

## 2017-07-30 SURGICAL SUPPLY — 18 items
ABLATOR ENDOMETRIAL BIPOLAR (ABLATOR) ×3 IMPLANT
CANISTER SUCT 3000ML PPV (MISCELLANEOUS) ×3 IMPLANT
CATH ROBINSON RED A/P 16FR (CATHETERS) ×3 IMPLANT
GLOVE BIO SURGEON STRL SZ 6.5 (GLOVE) ×1 IMPLANT
GLOVE BIO SURGEON STRL SZ7 (GLOVE) ×3 IMPLANT
GLOVE BIO SURGEONS STRL SZ 6.5 (GLOVE) ×1
GLOVE BIOGEL PI IND STRL 6.5 (GLOVE) IMPLANT
GLOVE BIOGEL PI IND STRL 7.0 (GLOVE) ×2 IMPLANT
GLOVE BIOGEL PI INDICATOR 6.5 (GLOVE) ×2
GLOVE BIOGEL PI INDICATOR 7.0 (GLOVE) ×4
GOWN STRL REUS W/TWL LRG LVL3 (GOWN DISPOSABLE) ×6 IMPLANT
GOWN STRL REUS W/TWL XL LVL3 (GOWN DISPOSABLE) ×1 IMPLANT
HEMOSTAT ARISTA ABSORB 3G PWDR (MISCELLANEOUS) ×2 IMPLANT
PACK VAGINAL MINOR WOMEN LF (CUSTOM PROCEDURE TRAY) ×3 IMPLANT
PAD OB MATERNITY 4.3X12.25 (PERSONAL CARE ITEMS) ×5 IMPLANT
TOWEL OR 17X24 6PK STRL BLUE (TOWEL DISPOSABLE) ×6 IMPLANT
TUBING AQUILEX INFLOW (TUBING) ×3 IMPLANT
TUBING AQUILEX OUTFLOW (TUBING) ×3 IMPLANT

## 2017-07-30 NOTE — Anesthesia Postprocedure Evaluation (Signed)
Anesthesia Post Note  Patient: Jacqueline Stanley  Procedure(s) Performed: DILATATION & CURETTAGE/HYSTEROSCOPY WITH FAILED ENDOMETRIAL ABLATION and ENDOMETRIAL CURRETTAGE (N/A Uterus)     Patient location during evaluation: PACU Anesthesia Type: General Level of consciousness: awake and alert Pain management: pain level controlled Vital Signs Assessment: post-procedure vital signs reviewed and stable Respiratory status: spontaneous breathing, nonlabored ventilation, respiratory function stable and patient connected to nasal cannula oxygen Cardiovascular status: blood pressure returned to baseline and stable Postop Assessment: no apparent nausea or vomiting Anesthetic complications: no    Last Vitals:  Vitals:   07/30/17 1300 07/30/17 1315  BP: 129/80 123/76  Pulse: 71 74  Resp: 13 14  Temp:    SpO2: 100% 100%    Last Pain:  Vitals:   07/30/17 0848  TempSrc: Oral                 Daryll Spisak S

## 2017-07-30 NOTE — Discharge Instructions (Signed)
°Post Anesthesia Home Care Instructions ° °Activity: °Get plenty of rest for the remainder of the day. A responsible individual must stay with you for 24 hours following the procedure.  °For the next 24 hours, DO NOT: °-Drive a car °-Operate machinery °-Drink alcoholic beverages °-Take any medication unless instructed by your physician °-Make any legal decisions or sign important papers. ° °Meals: °Start with liquid foods such as gelatin or soup. Progress to regular foods as tolerated. Avoid greasy, spicy, heavy foods. If nausea and/or vomiting occur, drink only clear liquids until the nausea and/or vomiting subsides. Call your physician if vomiting continues. ° °Special Instructions/Symptoms: °Your throat may feel dry or sore from the anesthesia or the breathing tube placed in your throat during surgery. If this causes discomfort, gargle with warm salt water. The discomfort should disappear within 24 hours. ° °If you had a scopolamine patch placed behind your ear for the management of post- operative nausea and/or vomiting: ° °1. The medication in the patch is effective for 72 hours, after which it should be removed.  Wrap patch in a tissue and discard in the trash. Wash hands thoroughly with soap and water. °2. You may remove the patch earlier than 72 hours if you experience unpleasant side effects which may include dry mouth, dizziness or visual disturbances. °3. Avoid touching the patch. Wash your hands with soap and water after contact with the patch. °  °Hysteroscopy, Care After °Refer to this sheet in the next few weeks. These instructions provide you with information on caring for yourself after your procedure. Your health care provider may also give you more specific instructions. Your treatment has been planned according to current medical practices, but problems sometimes occur. Call your health care provider if you have any problems or questions after your procedure. °What can I expect after the  procedure? °After your procedure, it is typical to have the following: °· You may have some cramping. This normally lasts for a couple days. °· You may have bleeding. This can vary from light spotting for a few days to menstrual-like bleeding for 3-7 days. ° °Follow these instructions at home: °· Rest for the first 1-2 days after the procedure. °· Only take over-the-counter or prescription medicines as directed by your health care provider. Do not take aspirin. It can increase the chances of bleeding. °· Take showers instead of baths for 2 weeks or as directed by your health care provider. °· Do not drive for 24 hours or as directed. °· Do not drink alcohol while taking pain medicine. °· Do not use tampons, douche, or have sexual intercourse for 2 weeks or until your health care provider says it is okay. °· Take your temperature twice a day for 4-5 days. Write it down each time. °· Follow your health care provider's advice about diet, exercise, and lifting. °· If you develop constipation, you may: °? Take a mild laxative if your health care provider approves. °? Add bran foods to your diet. °? Drink enough fluids to keep your urine clear or pale yellow. °· Try to have someone with you or available to you for the first 24-48 hours, especially if you were given a general anesthetic. °· Follow up with your health care provider as directed. °Contact a health care provider if: °· You feel dizzy or lightheaded. °· You feel sick to your stomach (nauseous). °· You have abnormal vaginal discharge. °· You have a rash. °· You have pain that is not controlled with medicine. °  Get help right away if:  You have bleeding that is heavier than a normal menstrual period.  You have a fever.  You have increasing cramps or pain, not controlled with medicine.  You have new belly (abdominal) pain.  You pass out.  You have pain in the tops of your shoulders (shoulder strap areas).  You have shortness of breath. This  information is not intended to replace advice given to you by your health care provider. Make sure you discuss any questions you have with your health care provider. Document Released: 03/04/2013 Document Revised: 10/20/2015 Document Reviewed: 12/11/2012 Elsevier Interactive Patient Education  2017 [place] [facility] Home Care Instructions  Activity: Get plenty of rest for the remainder of the day. A responsible individual must stay with you for 24 hours following the procedure.  For the next 24 hours, DO NOT: -Drive a car -Paediatric nurse -Drink alcoholic beverages -Take any medication unless instructed by your physician -Make any legal decisions or sign important papers.  Meals: Start with liquid foods such as gelatin or soup. Progress to regular foods as tolerated. Avoid greasy, spicy, heavy foods. If nausea and/or vomiting occur, drink only clear liquids until the nausea and/or vomiting subsides. Call your physician if vomiting continues.  Special Instructions/Symptoms: Your throat may feel dry or sore from the anesthesia or the breathing tube placed in your throat during surgery. If this causes discomfort, gargle with warm salt water. The discomfort should disappear within 24 hours.  If you had a scopolamine patch placed behind your ear for the management of post- operative nausea and/or vomiting:  1. The medication in the patch is effective for 72 hours, after which it should be removed.  Wrap patch in a tissue and discard in the trash. Wash hands thoroughly with soap and water. 2. You may remove the patch earlier than 72 hours if you experience unpleasant side effects which may include dry mouth, dizziness or visual disturbances. 3. Avoid touching the patch. Wash your hands with soap and water after contact with the patch.

## 2017-07-30 NOTE — Transfer of Care (Signed)
Immediate Anesthesia Transfer of Care Note  Patient: Jacqueline Stanley  Procedure(s) Performed: DILATATION & CURETTAGE/HYSTEROSCOPY WITH FAILED ENDOMETRIAL ABLATION and ENDOMETRIAL CURRETTAGE (N/A Uterus)  Patient Location: PACU  Anesthesia Type:General  Level of Consciousness: awake, alert  and oriented  Airway & Oxygen Therapy: Patient Spontanous Breathing and Patient connected to nasal cannula oxygen  Post-op Assessment: Report given to RN and Post -op Vital signs reviewed and stable  Post vital signs: Reviewed and stable  Last Vitals:  Vitals:   07/30/17 0848 07/30/17 1213  BP: 126/77 126/78  Pulse: 83 89  Resp: 18 15  Temp: 36.9 C (!) 36.4 C  SpO2:  100%    Last Pain:  Vitals:   07/30/17 0848  TempSrc: Oral      Patients Stated Pain Goal: 5 (23/30/07 6226)  Complications: No apparent anesthesia complications

## 2017-07-30 NOTE — Brief Op Note (Signed)
07/30/2017  12:25 PM  PATIENT:  Jacqueline Stanley  47 y.o. female  PRE-OPERATIVE DIAGNOSIS:  DUB Fibroid  POST-OPERATIVE DIAGNOSIS:  DUB Fibroid  PROCEDURE:  Procedure(s): DILATATION & CURETTAGE/HYSTEROSCOPY WITH FAILED ENDOMETRIAL ABLATION and ENDOMETRIAL CURRETTAGE (N/A)  SURGEON:  Surgeon(s) and Role:    * Lavonia Drafts, MD - Primary  ANESTHESIA:   general; paracervical nerve block  EBL:  150 mL   BLOOD ADMINISTERED:none  DRAINS: none   LOCAL MEDICATIONS USED:  MARCAINE     SPECIMEN:  Source of Specimen:  endometrial currettings  DISPOSITION OF SPECIMEN:  PATHOLOGY  COUNTS:  YES  TOURNIQUET:  * No tourniquets in log *  DICTATION: .Note written in EPIC  PLAN OF CARE: Discharge to home after PACU  PATIENT DISPOSITION:  PACU - hemodynamically stable.   Delay start of Pharmacological VTE agent (>24hrs) due to surgical blood loss or risk of bleeding: not applicable  Complications: none immediate  Jacqueline Howdyshell L. Stanley, M.D., [name]

## 2017-07-30 NOTE — Anesthesia Preprocedure Evaluation (Signed)
Anesthesia Evaluation  Patient identified by MRN, date of birth, ID band Patient awake    Reviewed: Allergy & Precautions, NPO status , Patient's Chart, lab work & pertinent test results  Airway Mallampati: II  TM Distance: >3 FB Neck ROM: Full    Dental no notable dental hx.    Pulmonary neg pulmonary ROS,    Pulmonary exam normal breath sounds clear to auscultation       Cardiovascular negative cardio ROS Normal cardiovascular exam Rhythm:Regular Rate:Normal     Neuro/Psych negative neurological ROS  negative psych ROS   GI/Hepatic negative GI ROS, Neg liver ROS,   Endo/Other  negative endocrine ROS  Renal/GU negative Renal ROS  negative genitourinary   Musculoskeletal negative musculoskeletal ROS (+)   Abdominal   Peds negative pediatric ROS (+)  Hematology  (+) anemia ,   Anesthesia Other Findings   Reproductive/Obstetrics negative OB ROS                             Anesthesia Physical Anesthesia Plan  ASA: II  Anesthesia Plan: General   Post-op Pain Management:    Induction: Intravenous  PONV Risk Score and Plan: 3 and Ondansetron, Dexamethasone and Midazolam  Airway Management Planned: LMA  Additional Equipment:   Intra-op Plan:   Post-operative Plan: Extubation in OR  Informed Consent: I have reviewed the patients History and Physical, chart, labs and discussed the procedure including the risks, benefits and alternatives for the proposed anesthesia with the patient or authorized representative who has indicated his/her understanding and acceptance.   Dental advisory given  Plan Discussed with: CRNA and Surgeon  Anesthesia Plan Comments:         Anesthesia Quick Evaluation

## 2017-07-30 NOTE — H&P (Signed)
Preoperative History and Physical  Jacqueline Stanley is a 47 y.o. No obstetric history on file. here for surgical management of AUB and uterine fibroids; anemia.   Proposed surgery: Hysteroscopy with possible resection of fibroids and endometrial ablation   Past Medical History:  Diagnosis Date  . Abnormal uterine bleeding (AUB)   . Uterine fibroid    Past Surgical History:  Procedure Laterality Date  . APPENDECTOMY  age 9  . TUBAL LIGATION Bilateral 04-14-2000  dr Delsa Sale  [facility]   PPTL  w/ General Anesthesia   OB History    No data available     Patient denies any cervical dysplasia or STIs. Medications Prior to Admission  Medication Sig Dispense Refill Last Dose  . Fe Fum-FePoly-FA-Vit C-Vit B3 (INTEGRA F) 125-1 MG CAPS Take 1 capsule by mouth daily. (Patient taking differently: Take 1 capsule by mouth every morning. ) 30 capsule 3 07/29/2017 at Unknown time  . megestrol (MEGACE) 40 MG tablet Take 1 tablet (40 mg total) by mouth daily. (Patient taking differently: Take 40 mg by mouth every morning. ) 30 tablet 3 07/29/2017 at Unknown time    No Known Allergies Social History:   reports that  has never smoked. she has never used smokeless tobacco. She reports that she does not drink alcohol or use drugs. History reviewed. No pertinent family history.  Review of Systems: Noncontributory  PHYSICAL EXAM: Blood pressure 126/77, pulse 83, temperature 98.4 F (36.9 C), temperature source Oral, resp. rate 18, height 5' (1.524 m), weight 135 lb 7 oz (61.4 kg). General appearance - alert, well appearing, and in no distress Chest - clear to auscultation, no wheezes, rales or rhonchi, symmetric air entry Heart - normal rate and regular rhythm Abdomen - soft, nontender, nondistended, no masses or organomegaly Pelvic - examination not indicated Extremities - peripheral pulses normal, no pedal edema, no clubbing or cyanosis  Labs: Results for orders placed or performed during the  hospital encounter of 07/30/17 (from the past 336 hour(s))  CBC   Collection Time: 07/24/17  9:19 AM  Result Value Ref Range   WBC 5.9 4.0 - 10.5 K/uL   RBC 4.14 3.87 - 5.11 MIL/uL   Hemoglobin 11.5 (L) 12.0 - 15.0 g/dL   HCT 35.4 (L) 36.0 - 46.0 %   MCV 85.5 78.0 - 100.0 fL   MCH 27.8 26.0 - 34.0 pg   MCHC 32.5 30.0 - 36.0 g/dL   RDW 24.0 (H) 11.5 - 15.5 %   Platelets 325 150 - 400 K/uL  Pregnancy, urine POC   Collection Time: 07/30/17  8:49 AM  Result Value Ref Range   Preg Test, Ur NEGATIVE NEGATIVE    Imaging Studies: No results found.  Assessment: Patient Active Problem List   Diagnosis Date Noted  . Abnormal uterine bleeding (AUB) 07/04/2017  . Pelvic adhesions 07/04/2017  . Fibroids 07/04/2017  . Symptomatic anemia 05/30/2017  . Dysfunctional uterine bleeding 05/30/2017    Plan: Patient will undergo surgical management with Hysteroscopy with possible resection of fibroids and endometrial ablation   The risks of surgery were discussed in detail with the patient including but not limited to: bleeding which may require transfusion or reoperation; infection which may require antibiotics; injury to surrounding organs which may involve bowel, bladder, ureters ; need for additional procedures including laparoscopy or laparotomy; thromboembolic phenomenon, surgical site problems and other postoperative/anesthesia complications. Likelihood of success in alleviating the patient's condition was discussed. Routine postoperative instructions will be reviewed with the patient  and her family in detail after surgery.  The patient concurred with the proposed plan, giving informed written consent for the surgery.  Patient has been NPO since last night she will remain NPO for procedure.  Anesthesia and OR aware.  Preoperative prophylactic antibiotics and SCDs ordered on call to the OR.  To OR when ready.  Wiley Flicker L. Ihor Dow, M.D., [facility] 07/30/2017 10:32 AM

## 2017-07-30 NOTE — Op Note (Signed)
07/30/2017  12:25 PM  PATIENT:  Jacqueline Stanley  47 y.o. female  PRE-OPERATIVE DIAGNOSIS:  DUB Fibroid  POST-OPERATIVE DIAGNOSIS:  DUB Fibroid  PROCEDURE:  Procedure(s): DILATATION & CURETTAGE/HYSTEROSCOPY WITH FAILED ENDOMETRIAL ABLATION and ENDOMETRIAL CURRETTAGE (N/A)  SURGEON:  Surgeon(s) and Role:    * Lavonia Drafts, MD - Primary  ANESTHESIA:   general; paracervical nerve block  EBL:  150 mL   BLOOD ADMINISTERED:none  DRAINS: none   LOCAL MEDICATIONS USED:  MARCAINE     SPECIMEN:  Source of Specimen:  endometrial currettings  DISPOSITION OF SPECIMEN:  PATHOLOGY  COUNTS:  YES  TOURNIQUET:  * No tourniquets in log *  DICTATION: .Note written in EPIC  PLAN OF CARE: Discharge to home after PACU  PATIENT DISPOSITION:  PACU - hemodynamically stable.   Delay start of Pharmacological VTE agent (>24hrs) due to surgical blood loss or risk of bleeding: not applicable  Complications: none immediate  Indications:  47 yo with AUB. Sx improved on Megace. Uterine fibroids with the [place] suggesting a submucosal fibroid.    Findings: no submucosal fibroid. A mod amount of endometrial curettings.   The risks, benefits, and alternatives of surgery were explained, understood, and accepted. The consents were signed and all questions were answered. She was taken to the operating room and general anesthesia was applied without complication. She was placed in the dorsal lithotomy position and her vagina and abdomen were prepped and draped in the usual sterile fashion. A bimanual exam revealed a normal size and shape retrioverted mobile uterus. Her adnexa were non-enlarged.   A bivalved speculum was placed in the patients' vagina and the anterior lip of the cervix was grasped with a single toothed tenaculum. A paracervical block was performed at 5 and 7 o'clock with 10cc of 0.5% Marcaine at each site.   The endometrial cavity was sounded to 10.5cm and the endocervical length  measured 4cm. A hysteroscope was inserted and the endometrium was noted to be thickened with several polyps.  The ostia on both sides were noted.  The scope was removed and a sharp currete was used to scape the lining of the uterus until a gritty texture was noted throughout.  Specimens were sent to pathology.  The NovaSure device was then inserted and seated using 6.5cm as the cavity length and 4.3cm as the cavity width. Despite multiple attempts, the device would not activate. I suspect that this is due to the size and shape of the uterine fundus. The hysteroscope was reinserted and found to be intact. And there was no excessive fluid loss. The single toothed tenaculum was removed at the end of the case and there was bleeding that resolved with placement of Arista.    The patient was extubated and taken to the recovery room in stable condition.  Sponge, lap and instrument counts were correct.  There were no complications.   Jermel Artley L. Harraway-Smith, M.D., [name]

## 2017-07-30 NOTE — Anesthesia Procedure Notes (Signed)
Procedure Name: LMA Insertion Date/Time: 07/30/2017 10:47 AM Performed by: Bufford Spikes, CRNA Pre-anesthesia Checklist: Patient identified, Emergency Drugs available, Suction available and Patient being monitored Patient Re-evaluated:Patient Re-evaluated prior to induction Oxygen Delivery Method: Circle system utilized Preoxygenation: Pre-oxygenation with 100% oxygen Induction Type: IV induction Ventilation: Mask ventilation without difficulty LMA: LMA inserted LMA Size: 4.0 Number of attempts: 1 Airway Equipment and Method: Bite block Placement Confirmation: positive ETCO2 Tube secured with: Tape Dental Injury: Teeth and Oropharynx as per pre-operative assessment

## 2017-07-31 ENCOUNTER — Encounter (HOSPITAL_BASED_OUTPATIENT_CLINIC_OR_DEPARTMENT_OTHER): Payer: Self-pay | Admitting: Obstetrics & Gynecology

## 2017-08-01 ENCOUNTER — Telehealth: Payer: Self-pay | Admitting: Obstetrics & Gynecology

## 2017-08-01 ENCOUNTER — Telehealth: Payer: Self-pay | Admitting: General Practice

## 2017-08-01 NOTE — Telephone Encounter (Signed)
Called and notified patient of Post op appointment with Dr. Ihor Dow on 08/26/17.  Patient voiced understanding.

## 2017-08-01 NOTE — Telephone Encounter (Signed)
TC to pt to review her surg path. Pt c/o some mild cramping controlled by the Motrin.  I reviewed the surgery with her and the failed endometrial  Ablation . I offered her continued Megace vs a hysterectomy. Pt would like to see how her sx cont with the Megace. She will f/u in 4 week for her post op check or sooner prn.  Janice Seales L. Harraway-Smith, M.D., [name]

## 2017-08-13 ENCOUNTER — Telehealth: Payer: Self-pay

## 2017-08-13 NOTE — Telephone Encounter (Signed)
Pt LM stating that she is having a menstrual from her surgery on 07/30/17.  Can someone give her a callback?

## 2017-08-13 NOTE — Progress Notes (Signed)
Pt presents to the office concerned about AUB. Pt states that she is using a pad and tissue but only the tissue is being saturated. Pt states that bleeding stopped and then started back a few days ago.Pt advised to continue the Megace 40 mg daily and to follow up at her Post op appt on 08/26/17.Pt verbalized understanding and had no questions.

## 2017-08-26 ENCOUNTER — Telehealth: Payer: Self-pay | Admitting: General Practice

## 2017-08-26 ENCOUNTER — Encounter: Payer: Medicaid Other | Admitting: Obstetrics & Gynecology

## 2017-08-26 NOTE — Telephone Encounter (Signed)
Called patient to reschedule appointment, but no answer.  Left message on VM for patient to give our office a call back in regards to appointment scheduled on 09/05/17 at 4:15pm.

## 2017-08-29 ENCOUNTER — Encounter: Payer: Self-pay | Admitting: Obstetrics & Gynecology

## 2017-08-29 NOTE — Progress Notes (Signed)
Pt left without being seen.

## 2017-09-05 ENCOUNTER — Ambulatory Visit (INDEPENDENT_AMBULATORY_CARE_PROVIDER_SITE_OTHER): Payer: Self-pay | Admitting: Obstetrics & Gynecology

## 2017-09-05 ENCOUNTER — Encounter: Payer: Self-pay | Admitting: Obstetrics & Gynecology

## 2017-09-05 VITALS — BP 133/74 | HR 91 | Ht 60.0 in | Wt 140.1 lb

## 2017-09-05 DIAGNOSIS — N939 Abnormal uterine and vaginal bleeding, unspecified: Secondary | ICD-10-CM

## 2017-09-05 DIAGNOSIS — D649 Anemia, unspecified: Secondary | ICD-10-CM

## 2017-09-05 DIAGNOSIS — N938 Other specified abnormal uterine and vaginal bleeding: Secondary | ICD-10-CM

## 2017-09-05 NOTE — Progress Notes (Signed)
History:  47 y.o.  here today for post op check. G3P3. Pt is s/p BTL Pt. is s/p hysteroscopy with D&C and attempted endometrial ablation on 3/5/20149. She reports no bleeding since 2 weeks of post op spotting. She reports that the Megace controls her bleeding quite well. She is not interested in other tx options at present.  She deneis feeeling dizzy or lightheaded or weak .  The following portions of the patient's history were reviewed and updated as appropriate: allergies, current medications, past family history, past medical history, past social history, past surgical history and problem list.  Review of Systems:  Pertinent items are noted in HPI.    Objective:  Physical Exam BP 133/74   Pulse 91   Ht 5' (1.524 m)   Wt 140 lb 1.6 oz (63.5 kg)   BMI 27.36 kg/m   CONSTITUTIONAL: Well-developed, well-nourished female in no acute distress.  HENT:  Normocephalic, atraumatic EYES: Conjunctivae and EOM are normal. No scleral icterus.  NECK: Normal range of motion SKIN: Skin is warm and dry. No rash noted. Not diaphoretic.No pallor. [place]: Alert and oriented to person, place, and time. Normal coordination.   Labs and Imaging 07/30/2017 Diagnosis Endometrium, curettage - POLYPOID BENIGN INACTIVE ENDOMETRIUM WITH PROGESTIN EFFECT. - SCANT FRAGMENTS OF UNREMARKABLE MYOMETRIUM.  Assessment & Plan:  AUB- failed endometrial ablation. Pt is on Megace and she denies bleeding. I discussed with her surgery vs the LnIUD but, she opts to remain on the Megace at present.   Refilled megace 40mg  daily F/u in 3 months or sooner prn CBC today  Total face-to-face time with patient was 15 min.  Greater than 50% was spent in counseling and coordination of care with the patient.   Arran Fessel L. Harraway-Smith, M.D., [name]

## 2017-09-06 LAB — CBC
HEMATOCRIT: 37.6 % (ref 34.0–46.6)
Hemoglobin: 12.1 g/dL (ref 11.1–15.9)
MCH: 30.6 pg (ref 26.6–33.0)
MCHC: 32.2 g/dL (ref 31.5–35.7)
MCV: 95 fL (ref 79–97)
PLATELETS: 358 10*3/uL (ref 150–379)
RBC: 3.96 x10E6/uL (ref 3.77–5.28)
RDW: 15.4 % (ref 12.3–15.4)
WBC: 7 10*3/uL (ref 3.4–10.8)

## 2017-09-06 MED ORDER — MEGESTROL ACETATE 40 MG PO TABS: 40.0000 mg | ORAL_TABLET | Freq: Every morning | ORAL | 12 refills | Status: DC

## 2017-10-18 ENCOUNTER — Other Ambulatory Visit: Payer: Self-pay | Admitting: Obstetrics & Gynecology

## 2017-10-18 DIAGNOSIS — N938 Other specified abnormal uterine and vaginal bleeding: Secondary | ICD-10-CM

## 2017-10-18 DIAGNOSIS — D649 Anemia, unspecified: Secondary | ICD-10-CM

## 2017-10-18 MED ORDER — INTEGRA F 125-1 MG PO CAPS
1.0000 | ORAL_CAPSULE | Freq: Every day | ORAL | 3 refills | Status: DC
Start: 2017-10-18 — End: 2019-11-02

## 2018-01-06 ENCOUNTER — Ambulatory Visit (INDEPENDENT_AMBULATORY_CARE_PROVIDER_SITE_OTHER): Payer: Medicaid Other | Admitting: Obstetrics & Gynecology

## 2018-01-06 ENCOUNTER — Encounter: Payer: Self-pay | Admitting: Obstetrics & Gynecology

## 2018-01-06 VITALS — BP 121/72 | HR 81 | Ht 60.0 in | Wt 144.0 lb

## 2018-01-06 DIAGNOSIS — N939 Abnormal uterine and vaginal bleeding, unspecified: Secondary | ICD-10-CM | POA: Diagnosis not present

## 2018-01-06 DIAGNOSIS — D649 Anemia, unspecified: Secondary | ICD-10-CM

## 2018-01-06 DIAGNOSIS — N938 Other specified abnormal uterine and vaginal bleeding: Secondary | ICD-10-CM | POA: Diagnosis not present

## 2018-01-06 MED ORDER — MEGESTROL ACETATE 40 MG PO TABS: 40.0000 mg | ORAL_TABLET | Freq: Every morning | ORAL | 12 refills | Status: DC

## 2018-01-06 NOTE — Progress Notes (Signed)
History:  47 y.o. Pt presents for f/u of AUB. She reports that she feels 'great!' Pt reports spotting last month and 2 months prev had a light period that lasted 3-4 days but, her overall bleeding since she began the meds has been very light. Pt reports that she has been taking the Megace but, can no longer afford the Megace and the iron. Last PAP 05/2017- WNL  The following portions of the patient's history were reviewed and updated as appropriate: allergies, current medications, past family history, past medical history, past social history, past surgical history and problem list.  Review of Systems:  Pertinent items are noted in HPI.    Objective:  Physical Exam Height 5' (1.524 m), weight 144 lb (65.3 kg), last menstrual period 11/06/2017.  CONSTITUTIONAL: Well-developed, well-nourished female in no acute distress.  HENT:  Normocephalic, atraumatic EYES: Conjunctivae and EOM are normal. No scleral icterus.  NECK: Normal range of motion SKIN: Skin is warm and dry. No rash noted. Not diaphoretic.No pallor. [place]: Alert and oriented to person, place, and time. Normal coordination.  Lungs: CTA CV: RRR Abd: Soft, nontender and nondistended Pelvic: deferred. Not indicated.   Labs and Imaging 07/30/2017 Diagnosis Endometrium, curettage - POLYPOID BENIGN INACTIVE ENDOMETRIUM WITH PROGESTIN EFFECT. - SCANT FRAGMENTS OF UNREMARKABLE MYOMETRIUM.  Assessment & Plan:  AUB- improved on Megace. Pt was not appreciably anemic on her last CBC  Keep Megace 40mg  q day  F/u in 1 year  Mammogram ordered  Jaanai Salemi L. Harraway-Smith, M.D., [name]

## 2018-02-05 ENCOUNTER — Inpatient Hospital Stay: Admission: RE | Admit: 2018-02-05 | Payer: Medicaid Other | Source: Ambulatory Visit

## 2018-04-18 IMAGING — US US PELVIS COMPLETE TRANSABD/TRANSVAG
1 series · 13 of 25 positions shown · non-contrast
Comparison: None

CLINICAL DATA: 46-year-old female with symptomatic anemia and
dysfunctional uterine bleeding.

EXAM:
TRANSABDOMINAL AND TRANSVAGINAL ULTRASOUND OF PELVIS
TECHNIQUE: Both transabdominal and transvaginal ultrasound examinations of the
pelvis were performed. Transabdominal technique was performed for
global imaging of the pelvis including uterus, ovaries, adnexal
regions, and pelvic cul-de-sac. It was necessary to proceed with
endovaginal exam following the transabdominal exam to visualize the
ovaries and endometrium.

[Series 1: us pelvis complete transabd/transvag · 0.24mm/px · 79 acquisitions, 13 frames shown]
[im 1/79]
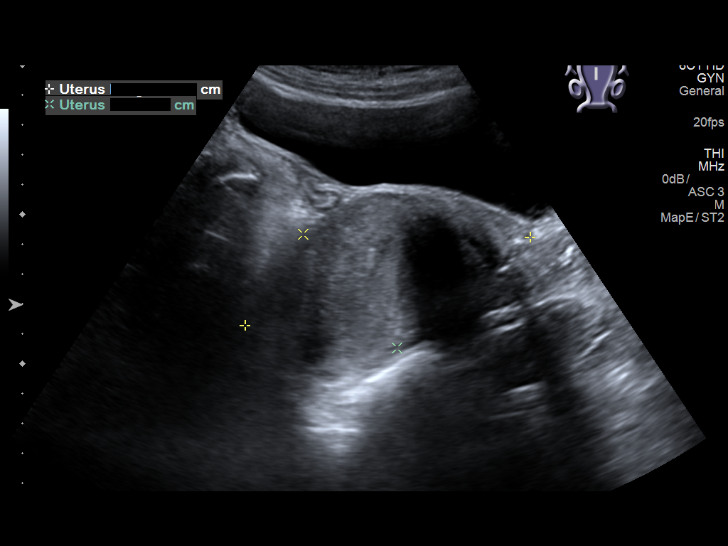
[im 7/79]
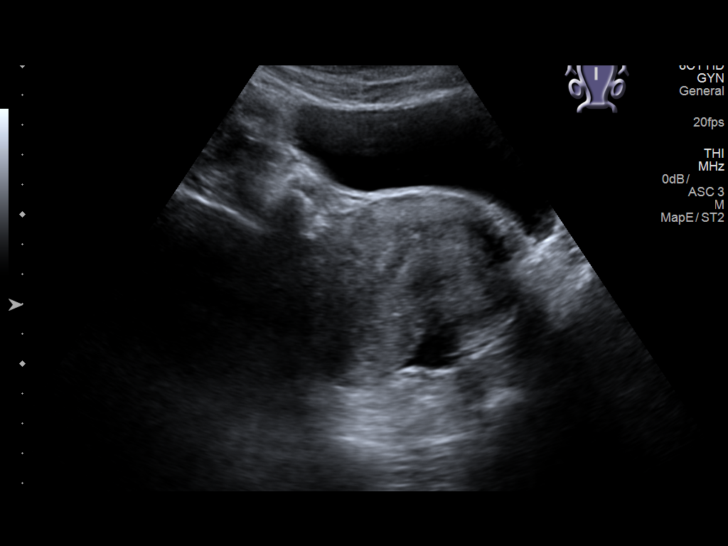
[im 14/79]
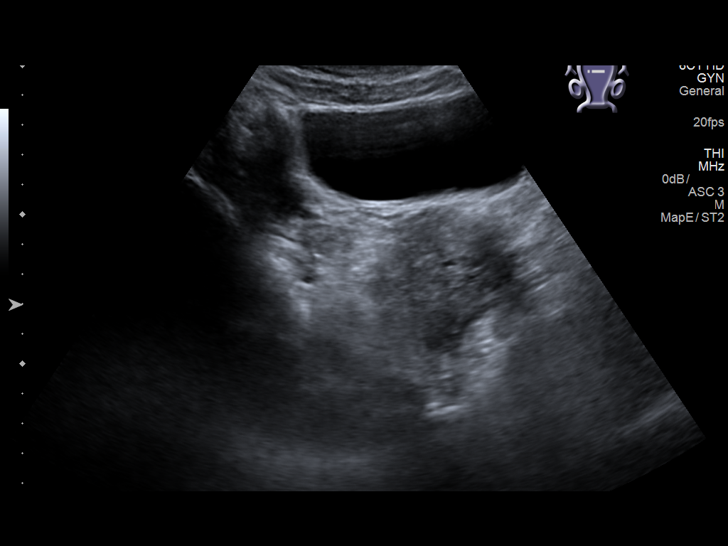
[im 20/79]
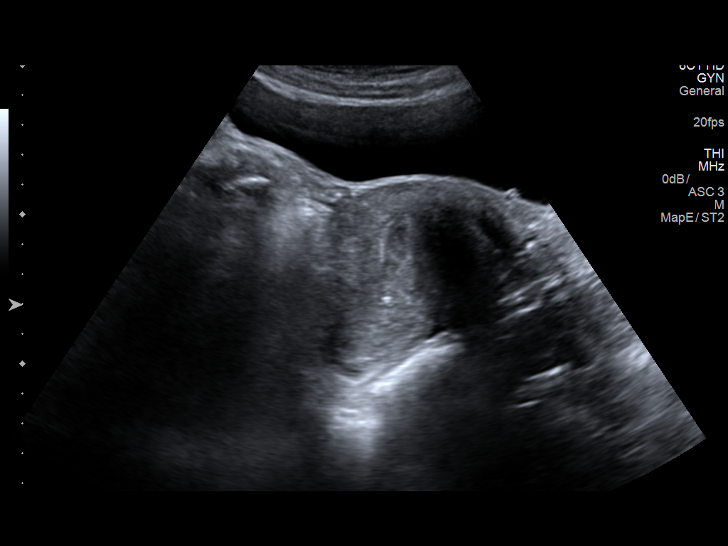
[im 27/79]
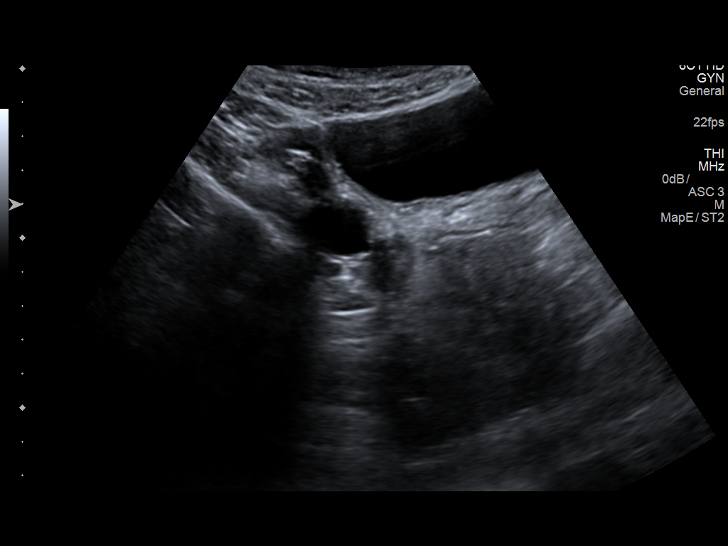
[im 33/79]
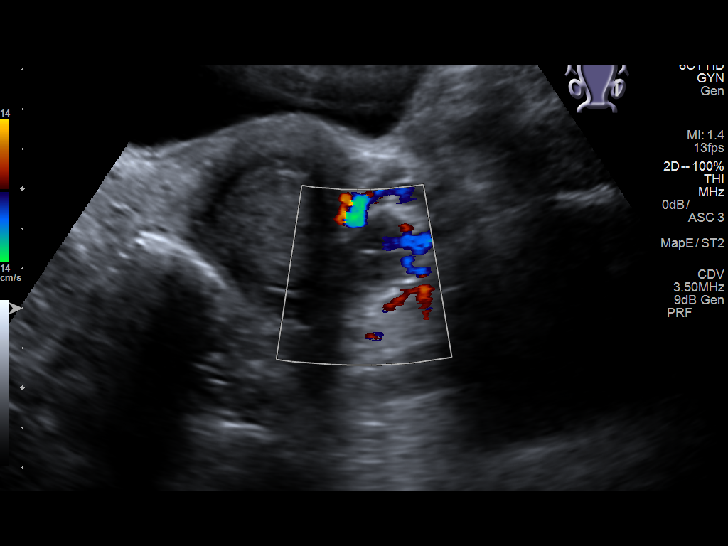
[im 40/79]
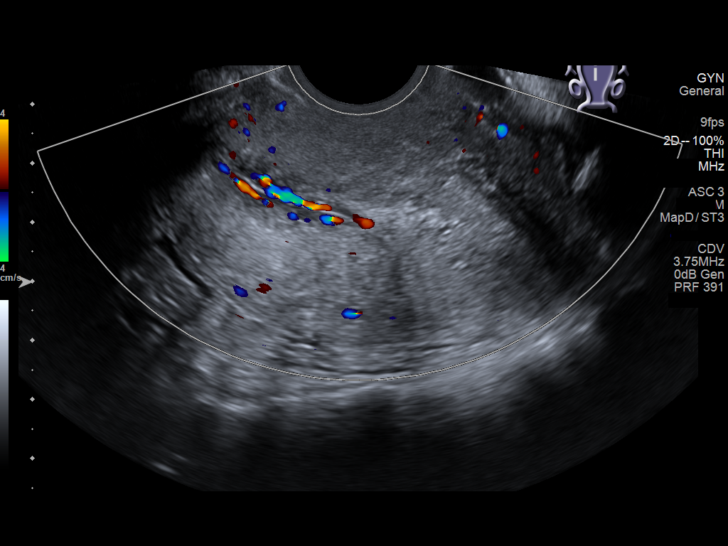
[im 46/79]
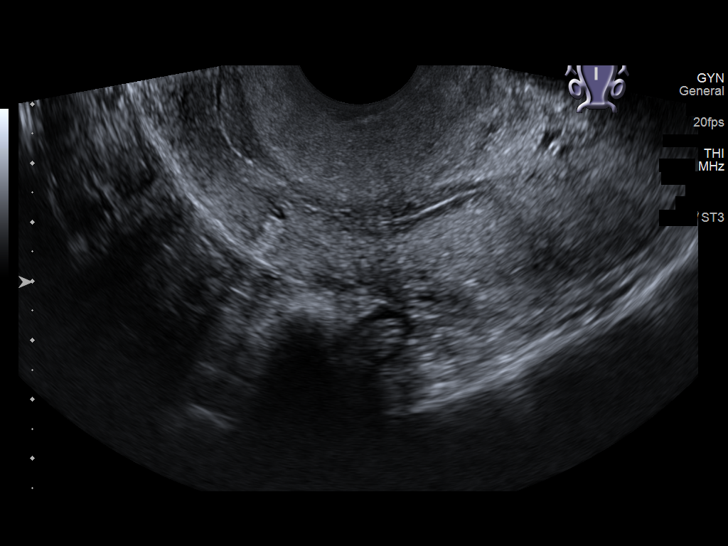
[im 53/79]
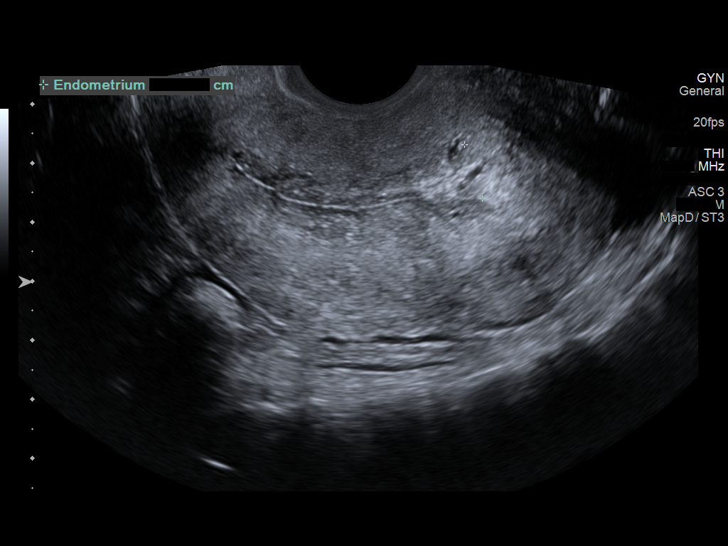
[im 59/79]
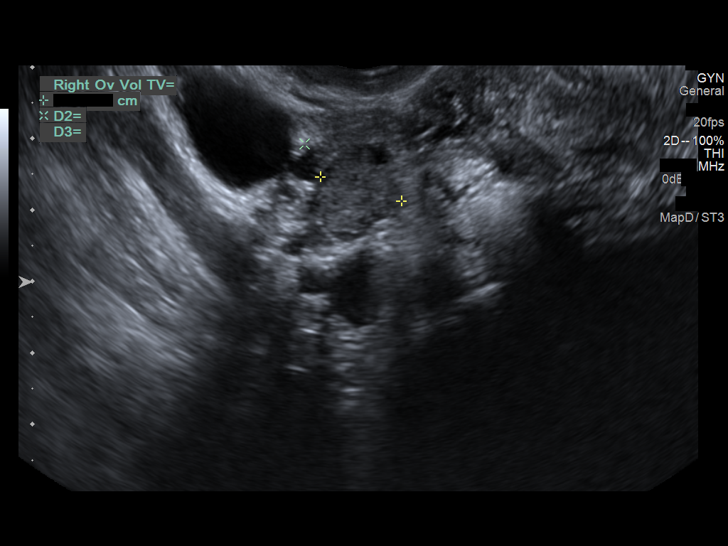
[im 66/79]
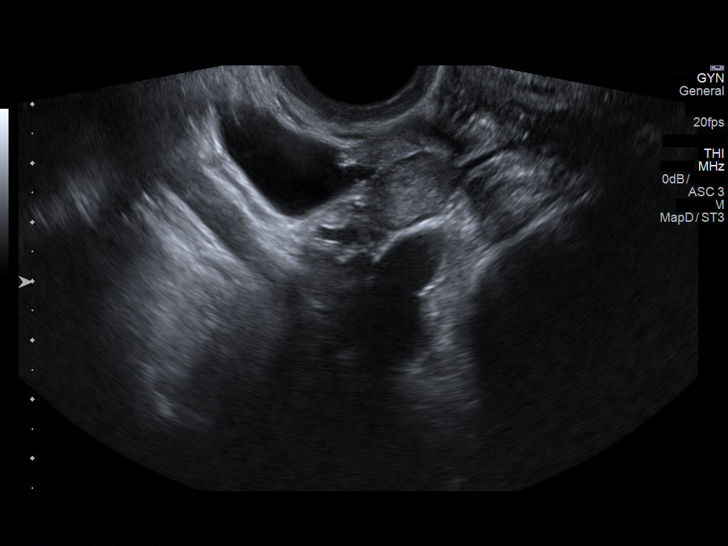
[im 72/79]
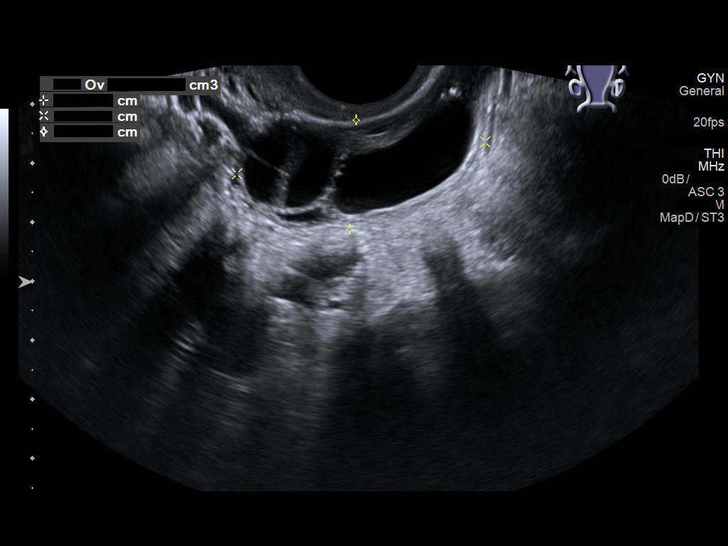
[im 79/79]
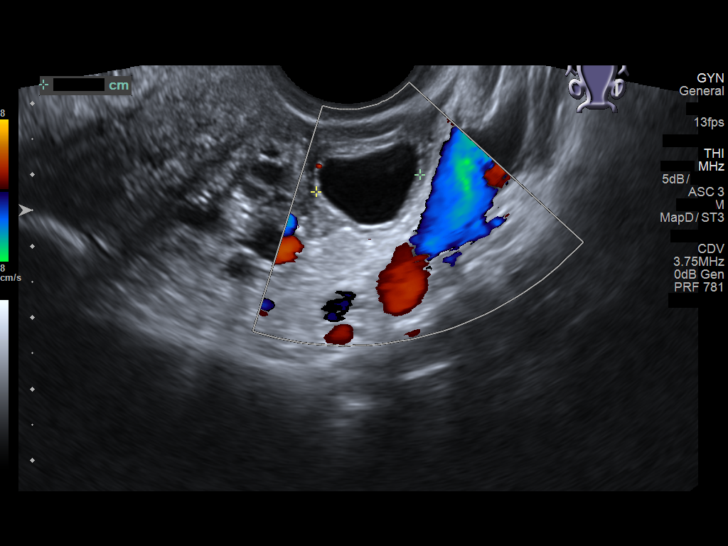

[13 of 25 positions shown; findings below may reference images not displayed]

FINDINGS: Uterus

Measurements: 9.9 x 4.9 x 6 cm and retroflexed. A 1.7 x 1.5 x 1.2 cm
posterior intramural uterine body fibroid is noted.

Endometrium

Thickness: Focally thickened at the fundus with some cystic change,
with focal thickening measuring 13 mm. The remainder the endometrial
stripe is unremarkable.

Right ovary

Measurements: 2.4 x 1.2 x 1.0 cm. Normal appearance/no suspicious
adnexal mass.

Left ovary

Measurements: 4.2 x 2 x 1.8 cm. Normal appearance/no suspicious
adnexal mass.

Other findings

A trace amount of free pelvic fluid is noted.
IMPRESSION: 1. Apparent focal thickening of the endometrial stripe at the
fundus. Consider further evaluation with sonohysterogram for
confirmation prior to hysteroscopy. Endometrial sampling should also
be considered if patient is at high risk for endometrial carcinoma.
(Ref: Radiological Reasoning: Algorithmic Workup of Abnormal Vaginal
Bleeding with Endovaginal Sonography and Sonohysterography. AJR
5116; 191:S68-73)
2. 1.7 cm posterior intramural uterine body fibroid.
3. Unremarkable ovaries.

## 2018-08-04 DIAGNOSIS — D649 Anemia, unspecified: Secondary | ICD-10-CM | POA: Diagnosis not present

## 2018-08-04 DIAGNOSIS — N920 Excessive and frequent menstruation with regular cycle: Secondary | ICD-10-CM | POA: Diagnosis not present

## 2018-08-04 DIAGNOSIS — Z1239 Encounter for other screening for malignant neoplasm of breast: Secondary | ICD-10-CM | POA: Diagnosis not present

## 2018-12-03 DIAGNOSIS — D649 Anemia, unspecified: Secondary | ICD-10-CM | POA: Diagnosis not present

## 2018-12-03 DIAGNOSIS — L259 Unspecified contact dermatitis, unspecified cause: Secondary | ICD-10-CM | POA: Diagnosis not present

## 2018-12-18 ENCOUNTER — Other Ambulatory Visit: Payer: Self-pay

## 2018-12-18 ENCOUNTER — Encounter: Payer: Medicaid Other | Admitting: Obstetrics & Gynecology

## 2018-12-18 NOTE — Progress Notes (Signed)
1427- Attempted to contact pt for visit. Pt phone line was busy. Will attempt to call back.   1436- Second attempt to contact pt for visit. Phone line still busy at this time.   1500- Third attempt. Phone line still busy at this time.

## 2018-12-31 ENCOUNTER — Ambulatory Visit (INDEPENDENT_AMBULATORY_CARE_PROVIDER_SITE_OTHER): Payer: Medicaid Other | Admitting: Obstetrics & Gynecology

## 2018-12-31 ENCOUNTER — Other Ambulatory Visit: Payer: Self-pay

## 2018-12-31 ENCOUNTER — Encounter: Payer: Self-pay | Admitting: Obstetrics & Gynecology

## 2018-12-31 DIAGNOSIS — D649 Anemia, unspecified: Secondary | ICD-10-CM

## 2018-12-31 DIAGNOSIS — N939 Abnormal uterine and vaginal bleeding, unspecified: Secondary | ICD-10-CM | POA: Diagnosis not present

## 2018-12-31 DIAGNOSIS — N938 Other specified abnormal uterine and vaginal bleeding: Secondary | ICD-10-CM

## 2018-12-31 DIAGNOSIS — Z1239 Encounter for other screening for malignant neoplasm of breast: Secondary | ICD-10-CM | POA: Diagnosis not present

## 2018-12-31 DIAGNOSIS — R200 Anesthesia of skin: Secondary | ICD-10-CM

## 2018-12-31 MED ORDER — MEGESTROL ACETATE 40 MG PO TABS: 40.0000 mg | ORAL_TABLET | Freq: Every morning | ORAL | 6 refills | Status: DC

## 2018-12-31 NOTE — Progress Notes (Signed)
Subjective:     Jacqueline Stanley is a 48 y.o. female here for a routine exam.  Current complaints: pt is still on the megace. She stopped taking it for several weeks and started to bleed again so she began to bleed so she restarted. She is taking 40mg  daily.  Not currently  Sexually active. Reports numbness of the finger tips since stopping iron.  She notes it mainly at night.    Gynecologic History No LMP recorded. Patient is premenopausal. Contraception: abstinence Last Pap: 06/06/2017. Results were: normal Last mammogram: pt has never had   Obstetric History OB History  No obstetric history on file.   The following portions of the patient's history were reviewed and updated as appropriate: allergies, current medications, past family history, past medical history, past social history, past surgical history and problem list.  Review of Systems Pertinent items are noted in HPI.    Objective:  There were no vitals taken for this visit. This was a phone visit. No exam done   Assessment:    Healthy female exam.   AUB- controlled  Anemia prev improved  Numbness of fingers- suspect carpal tunnel. Rec f/u in 6 months or sooner prn      Plan:   Screening mammogram F/u in 6-7 months  Refilled Megace Keep Megace 40mg  daily Will attempt a trial off in 6 months  Will call her primary care provider to order wrist splints.   Total face-to-face time with patient was12 min.  Greater than 50% was spent in counseling and coordination of care with the patient.   Althea Backs L. Harraway-Smith, M.D., [name]

## 2018-12-31 NOTE — Progress Notes (Signed)
I connected with  Jacqueline Stanley on 12/31/18 at 10:55 AM EDT by telephone and verified that I am speaking with the correct person using two identifiers.   I discussed the limitations, risks, security and privacy concerns of performing an evaluation and management service by telephone and the availability of in person appointments. I also discussed with the patient that there may be a patient responsible charge related to this service. The patient expressed understanding and agreed to proceed.  [place], [place] 12/31/2018  10:41 AM

## 2019-01-02 DIAGNOSIS — G5603 Carpal tunnel syndrome, bilateral upper limbs: Secondary | ICD-10-CM | POA: Diagnosis not present

## 2019-01-02 DIAGNOSIS — N920 Excessive and frequent menstruation with regular cycle: Secondary | ICD-10-CM | POA: Diagnosis not present

## 2019-01-02 DIAGNOSIS — D649 Anemia, unspecified: Secondary | ICD-10-CM | POA: Diagnosis not present

## 2019-01-15 ENCOUNTER — Encounter: Payer: Self-pay | Admitting: *Deleted

## 2019-01-22 ENCOUNTER — Telehealth: Payer: Self-pay | Admitting: *Deleted

## 2019-01-22 NOTE — Telephone Encounter (Signed)
-----   Message from Maggie G Martinique sent at 01/22/2019 11:07 AM EDT -----  ----- Message ----- From: Lavonia Drafts, MD Sent: 01/22/2019  10:58 AM EDT To: [facility] Admin Pool  Please call pharmacy to see WHY pts Megace cant be refilled. This is not a new med for her.   Thx,  Clh-S

## 2019-01-22 NOTE — Telephone Encounter (Signed)
Called [facility] to fu on patient Rx for megace. Informed it was filled and patient picked it up on 01/16/19. And has 6 refills.  Marquelle Balow,RN

## 2019-02-12 ENCOUNTER — Ambulatory Visit: Payer: Medicaid Other

## 2019-02-13 DIAGNOSIS — D649 Anemia, unspecified: Secondary | ICD-10-CM | POA: Diagnosis not present

## 2019-02-13 DIAGNOSIS — G5603 Carpal tunnel syndrome, bilateral upper limbs: Secondary | ICD-10-CM | POA: Diagnosis not present

## 2019-07-06 IMAGING — DX DG HIP (WITH OR WITHOUT PELVIS) 2-3V*L*
3 series · 3 of 3 positions shown · non-contrast
Comparison: None.

CLINICAL DATA: Pain

EXAM:
DG HIP (WITH OR WITHOUT PELVIS) 2-3V LEFT

[hip ap]
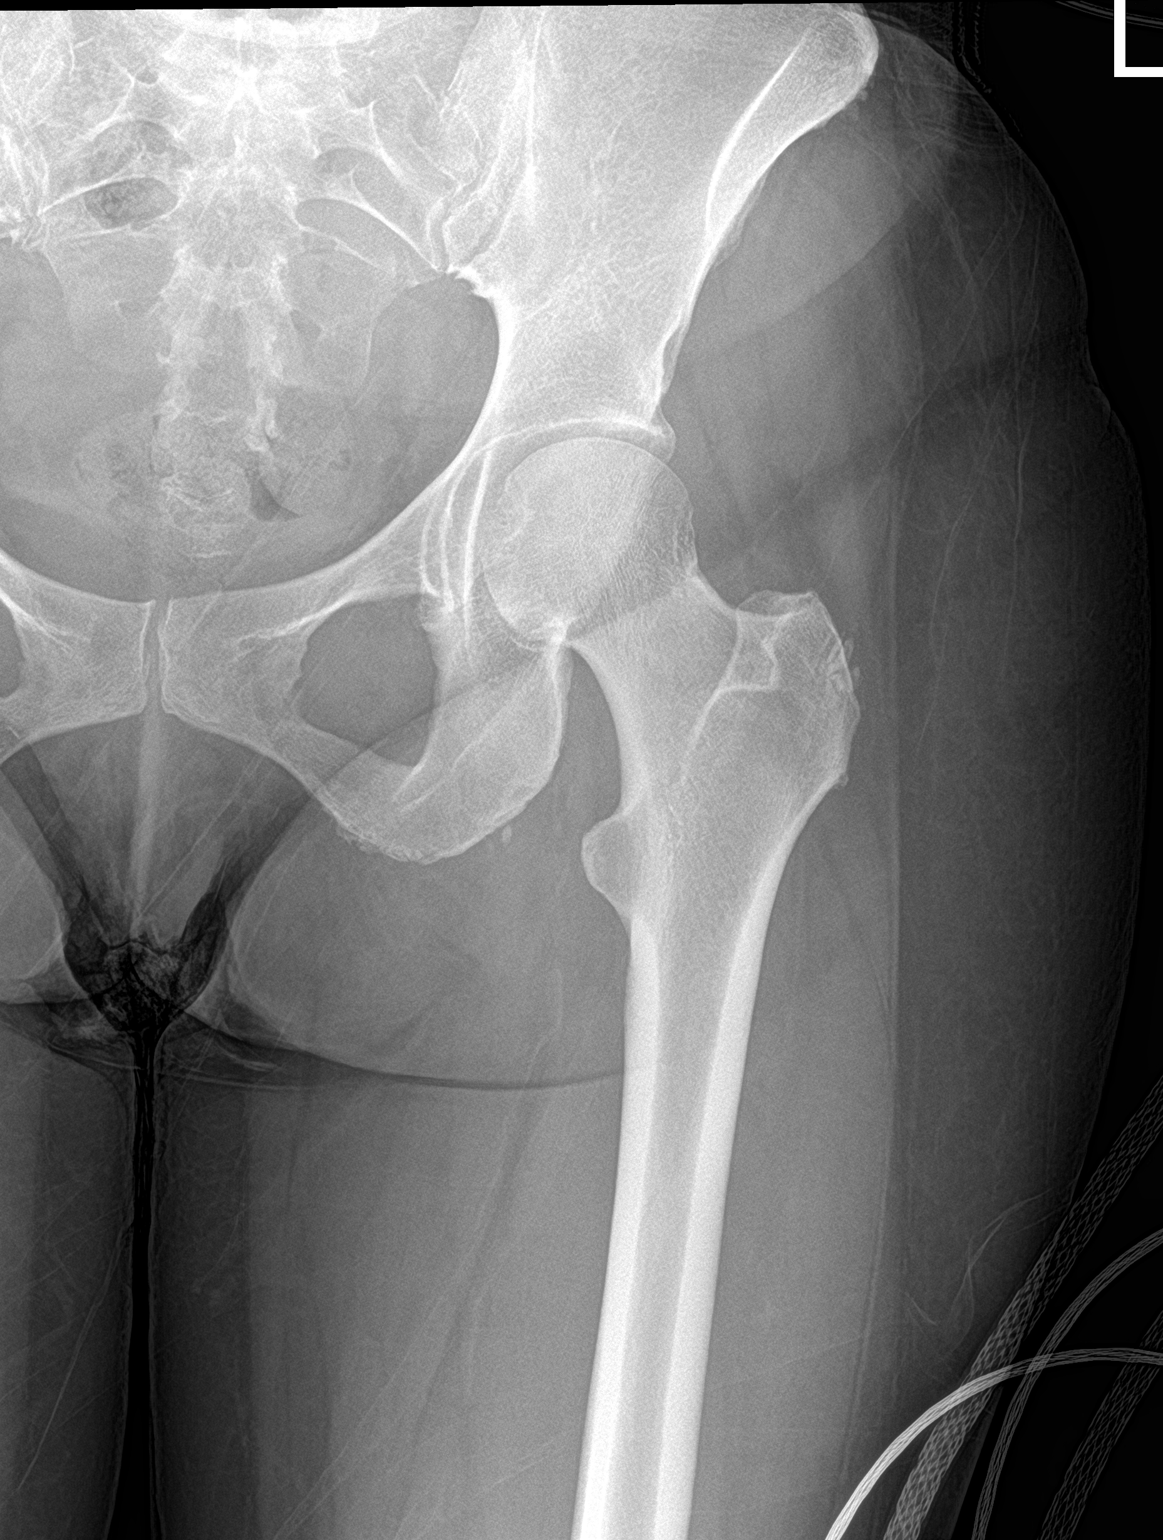

[hip lat]
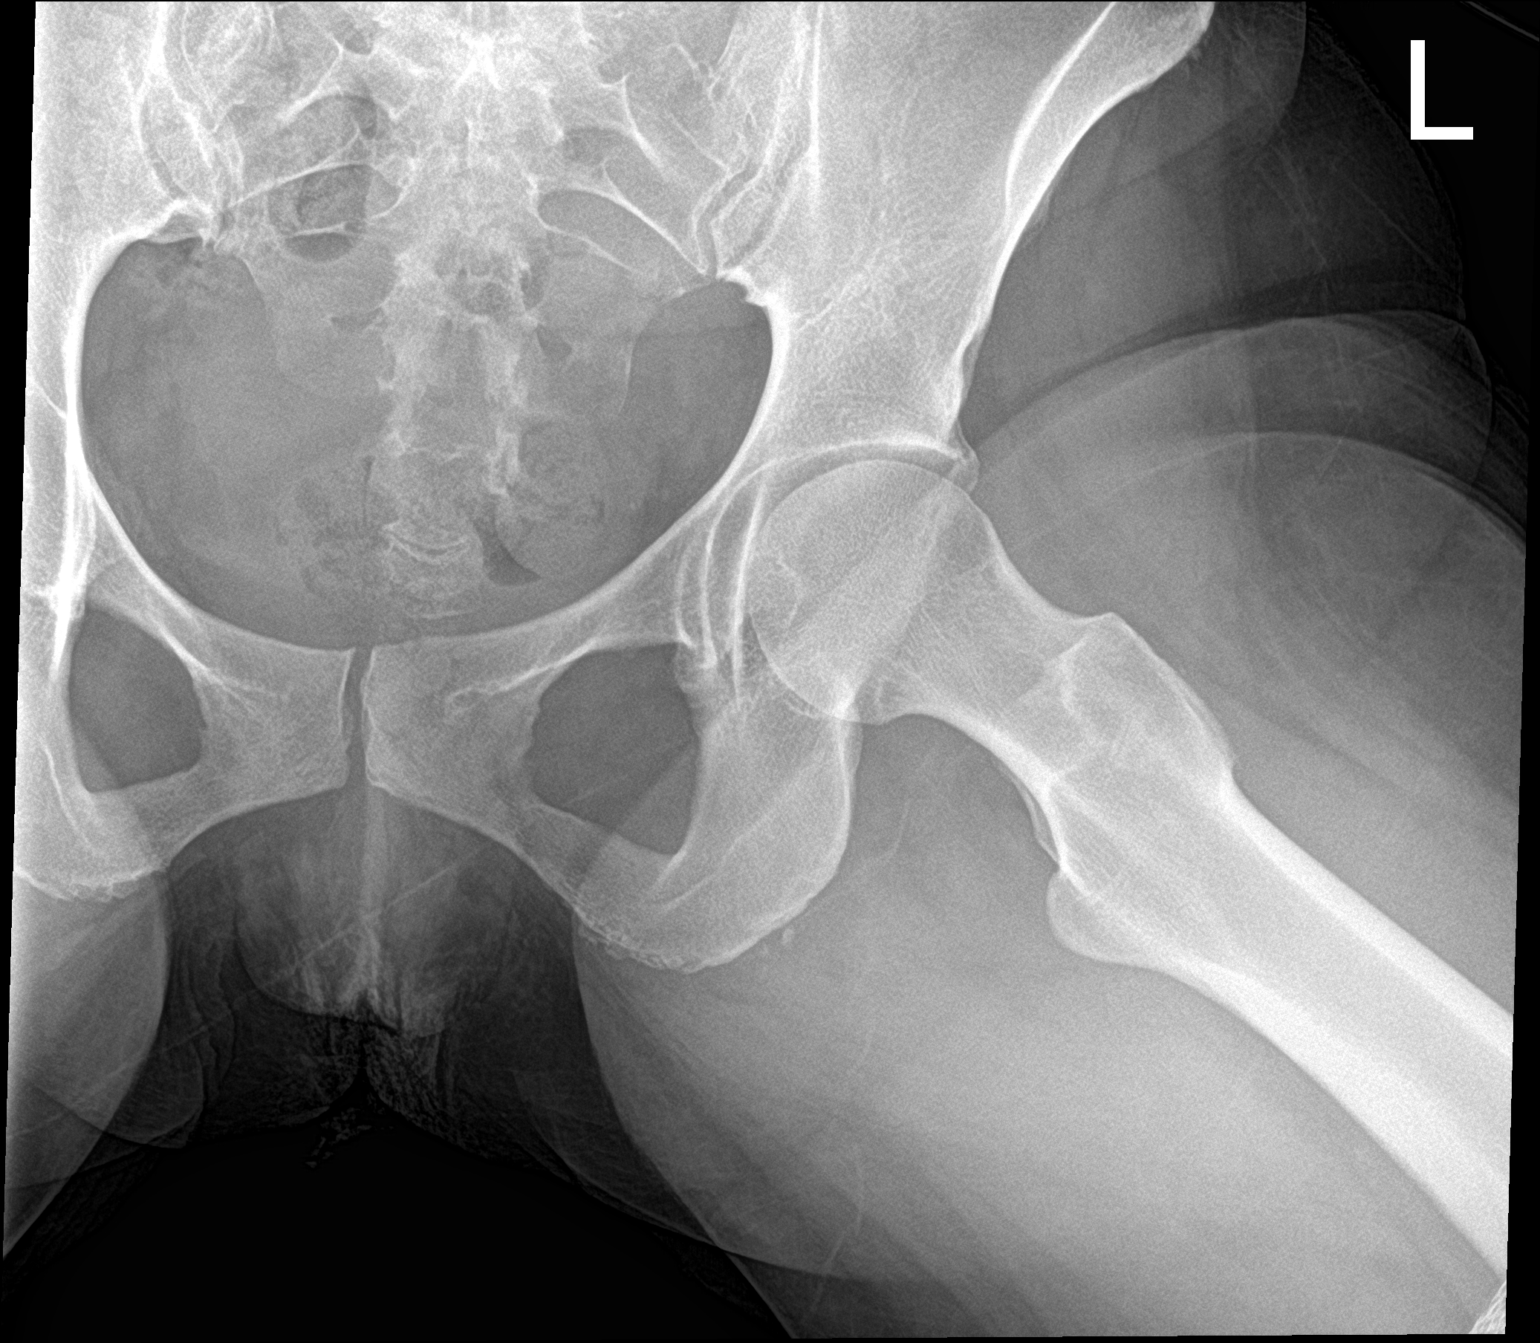

[pelvis ap]
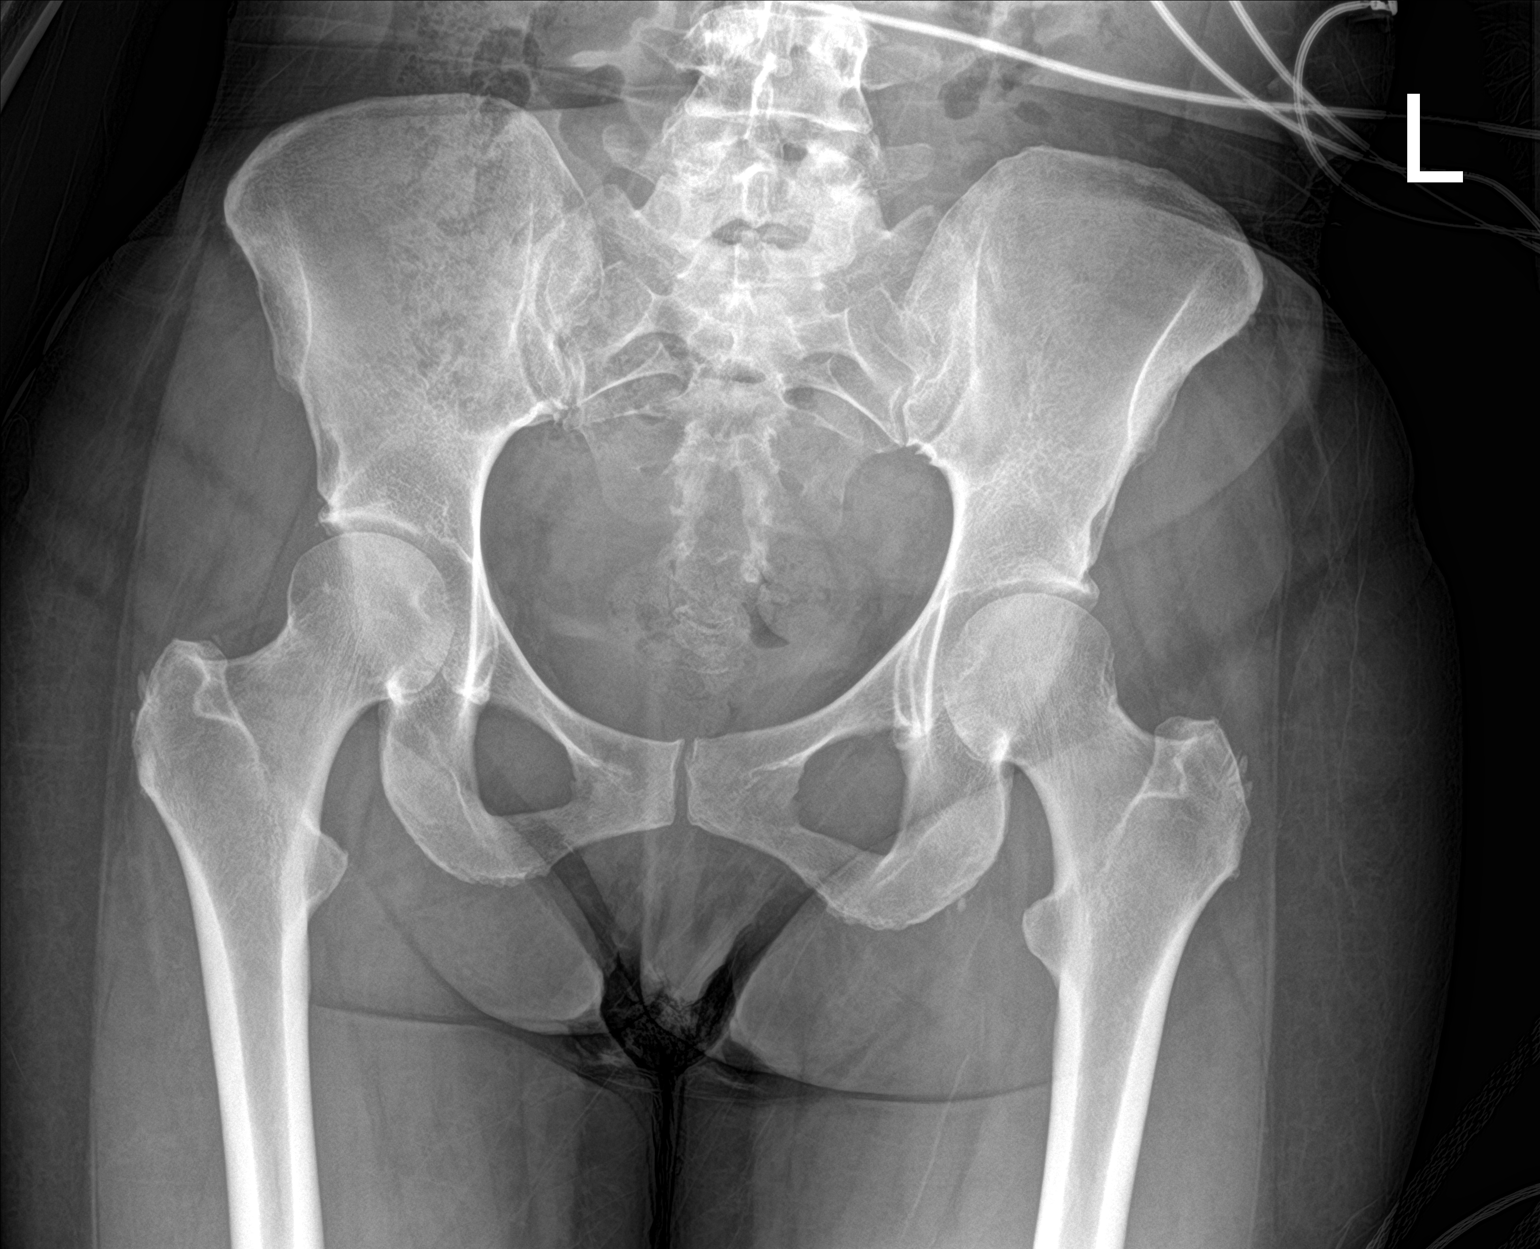

[3 of 3 positions shown; findings below may reference images not displayed]

FINDINGS: Hip joints and SI joints are symmetric and unremarkable. No acute
bony abnormality. Specifically, no fracture, subluxation, or
dislocation.
IMPRESSION: No acute bony abnormality.

## 2019-08-20 ENCOUNTER — Telehealth: Payer: Self-pay | Admitting: General Practice

## 2019-08-20 NOTE — Telephone Encounter (Signed)
Returned patients call to discuss her concerns. LM for patient to call the office at her convenience.

## 2019-08-20 NOTE — Telephone Encounter (Signed)
Patient called and left message on nurse voicemail line stating she wants to know if Dr Ihor Dow got her message. Per chart review, cannot see where message was sent.

## 2019-08-24 ENCOUNTER — Telehealth: Payer: Self-pay | Admitting: *Deleted

## 2019-08-24 DIAGNOSIS — D649 Anemia, unspecified: Secondary | ICD-10-CM

## 2019-08-24 DIAGNOSIS — N939 Abnormal uterine and vaginal bleeding, unspecified: Secondary | ICD-10-CM

## 2019-08-24 DIAGNOSIS — N938 Other specified abnormal uterine and vaginal bleeding: Secondary | ICD-10-CM

## 2019-08-24 NOTE — Telephone Encounter (Addendum)
Pt left VM message stating that she had left a message last week to Dr. Ihor Dow regarding a prescription to be authorized. She would like a call back ASAP.   3/30  0900 Call returned to pt and message left on her personal VM. Pt was informed that we do not have record of a call to Dr. Ihor Dow, however we had reached back out to her last week when she called the office regarding this concern. She was requested to call back with a new message stating the medication she needs refilled and her pharmacy.   3/30  1114  Pt returned my call @ 1011 and left message stating the medication she needs is Megestrol 40 mg. Refill was approved by Dr. Harolyn Rutherford and e-prescribed to [facility]. Per note from Dr. Ihor Dow, pt needs f/u appt in office to discuss future treatment plan. Pt was called and message left on her personal VM of need for appt. If she wants to be seen by Dr. Ihor Dow, she will need to call and schedule @ the [facility] office location - number provided. If she prefers to be seen in our office by a different provider, please call back with this information and we will schedule appt in May.

## 2019-08-25 MED ORDER — MEGESTROL ACETATE 40 MG PO TABS: 40.0000 mg | ORAL_TABLET | Freq: Every morning | ORAL | 1 refills | Status: DC

## 2019-10-19 ENCOUNTER — Encounter (HOSPITAL_COMMUNITY): Payer: Self-pay

## 2019-10-19 ENCOUNTER — Ambulatory Visit (HOSPITAL_COMMUNITY)
Admission: EM | Admit: 2019-10-19 | Discharge: 2019-10-19 | Disposition: A | Discharge status: Home / Self Care | Payer: Medicaid Other

## 2019-10-19 ENCOUNTER — Other Ambulatory Visit: Payer: Self-pay

## 2019-10-19 DIAGNOSIS — H6121 Impacted cerumen, right ear: Secondary | ICD-10-CM | POA: Diagnosis not present

## 2019-10-19 DIAGNOSIS — H938X1 Other specified disorders of right ear: Secondary | ICD-10-CM | POA: Diagnosis not present

## 2019-10-19 NOTE — ED Triage Notes (Signed)
Pt presents with right ear fullness today.

## 2019-10-19 NOTE — Discharge Instructions (Signed)
Cerumen Impaction  We have flushed your ears today.  You may use a solution of 1/2 water and 1/2 peroxide to clean your ears 1-2 times per week.  Follow up as needed.

## 2019-10-19 NOTE — ED Provider Notes (Signed)
[place]   TT:[id number] 10/19/19 Arrival Time: 1905  CC: EAR PAIN  SUBJECTIVE: History from: patient.  Jacqueline Stanley is a 49 y.o. female who presents with of right ear fullness for about 3 hours. Denies a precipitating event, such as swimming or wearing ear plugs. Patient has tried debrox ear solution without relief. Symptoms are made worse with lying down.  Reports similar symptoms in the past that improved with ear irrigation.   Denies fever, chills, fatigue, sinus pain, rhinorrhea, ear discharge, sore throat, SOB, wheezing, chest pain, nausea, changes in bowel or bladder habits.    ROS: As per HPI.  All other pertinent ROS negative.     Past Medical History:  Diagnosis Date  . Abnormal uterine bleeding (AUB)   . Uterine fibroid    Past Surgical History:  Procedure Laterality Date  . APPENDECTOMY  age 49  . DILITATION & CURRETTAGE/HYSTROSCOPY WITH NOVASURE ABLATION N/A 07/30/2017   Procedure: DILATATION & CURETTAGE/HYSTEROSCOPY WITH FAILED ENDOMETRIAL ABLATION and ENDOMETRIAL CURRETTAGE;  Surgeon: Lavonia Drafts, MD;  Location: [place];  Service: Gynecology;  Laterality: N/A;  . TUBAL LIGATION Bilateral 04-14-2000  dr Delsa Sale  [facility]   PPTL  w/ General Anesthesia   No Known Allergies No current facility-administered medications on file prior to encounter.   Current Outpatient Medications on File Prior to Encounter  Medication Sig Dispense Refill  . Fe Fum-FePoly-FA-Vit C-Vit B3 (INTEGRA F) 125-1 MG CAPS Take 1 capsule by mouth daily. 30 capsule 3  . megestrol (MEGACE) 40 MG tablet Take 1 tablet (40 mg total) by mouth every morning. 30 tablet 1   Social History   Socioeconomic History  . Marital status: Widowed    Spouse name: Not on file  . Number of children: Not on file  . Years of education: Not on file  . Highest education level: Not on file  Occupational History  . Occupation: unemployed  Tobacco Use  . Smoking status:  Never Smoker  . Smokeless tobacco: Never Used  Substance and Sexual Activity  . Alcohol use: No  . Drug use: No  . Sexual activity: Not on file  Other Topics Concern  . Not on file  Social History Narrative  . Not on file   Social Determinants of Health   Financial Resource Strain:   . Difficulty of Paying Living Expenses:   Food Insecurity:   . Worried About Charity fundraiser in the Last Year:   . Arboriculturist in the Last Year:   Transportation Needs:   . Film/video editor (Medical):   Marland Kitchen Lack of Transportation (Non-Medical):   Physical Activity:   . Days of Exercise per Week:   . Minutes of Exercise per Session:   Stress:   . Feeling of Stress :   Social Connections:   . Frequency of Communication with Friends and Family:   . Frequency of Social Gatherings with Friends and Family:   . Attends Religious Services:   . Active Member of Clubs or Organizations:   . Attends Archivist Meetings:   Marland Kitchen Marital Status:   Intimate Partner Violence:   . Fear of Current or Ex-Partner:   . Emotionally Abused:   Marland Kitchen Physically Abused:   . Sexually Abused:    History reviewed. No pertinent family history.  OBJECTIVE:  Vitals:   10/19/19 2014  BP: (!) 143/82  Pulse: 76  Resp: 17  Temp: 98.5 F (36.9 C)  TempSrc: Oral  SpO2:  100%     General appearance: alert; well appearing HEENT: Ears:L EAC clear, R EAC with cerumen impaction, TMs pearly gray with visible cone of light, without erythema; Eyes: PERRL, EOMI grossly; Sinuses nontender to palpation; Nose: clear rhinorrhea; Throat: oropharynx mildly erythematous, tonsils 1+ without white tonsillar exudates, uvula midline Neck: supple without LAD Lungs: unlabored respirations, symmetrical air entry; cough: absent; no respiratory distress Heart: regular rate and rhythm.  Radial pulses 2+ symmetrical bilaterally Skin: warm and dry Psychological: alert and cooperative; normal mood and affect  Imaging: No  results found.   ASSESSMENT & PLAN:  1. Ear fullness, right   2. Impacted cerumen of right ear     No orders of the defined types were placed in this encounter.  Cerumen Impaction R ear irrigated in office Rest and drink plenty of fluids  Continue to use OTC ibuprofen and/ or tylenol as needed for pain control Follow up with PCP if symptoms persists Return here or go to the ER if you have any new or worsening symptoms   Reviewed expectations re: course of current medical issues. Questions answered. Outlined signs and symptoms indicating need for more acute intervention. Patient verbalized understanding. After Visit Summary given.          Faustino Congress, NP 10/19/19 2029

## 2019-11-02 ENCOUNTER — Encounter: Payer: Self-pay | Admitting: Family Medicine

## 2019-11-02 ENCOUNTER — Other Ambulatory Visit: Payer: Self-pay

## 2019-11-02 ENCOUNTER — Ambulatory Visit (INDEPENDENT_AMBULATORY_CARE_PROVIDER_SITE_OTHER): Payer: Medicaid Other | Admitting: Family Medicine

## 2019-11-02 DIAGNOSIS — N938 Other specified abnormal uterine and vaginal bleeding: Secondary | ICD-10-CM

## 2019-11-02 DIAGNOSIS — N939 Abnormal uterine and vaginal bleeding, unspecified: Secondary | ICD-10-CM | POA: Diagnosis not present

## 2019-11-02 DIAGNOSIS — D649 Anemia, unspecified: Secondary | ICD-10-CM

## 2019-11-02 MED ORDER — MEGESTROL ACETATE 40 MG PO TABS: 40.0000 mg | ORAL_TABLET | Freq: Every morning | ORAL | 1 refills | Status: DC

## 2019-11-02 NOTE — Progress Notes (Signed)
° °  Subjective:    Patient ID: Jacqueline Stanley, female    DOB: [birth year], 49 y.o.   MRN: [id number]  HPI Patient seen for follow up of AUB. Has been on megace and, in the past, would start to bleed if she missed a dose. Her last dose was 5/31, started some spotting today.  Is having hot flashes periodically.    Review of Systems     Objective:   Physical Exam Vitals reviewed.  Constitutional:      Appearance: Normal appearance.  Cardiovascular:     Rate and Rhythm: Normal rate and regular rhythm.     Pulses: Normal pulses.  Pulmonary:     Effort: Pulmonary effort is normal.     Breath sounds: Normal breath sounds.  Abdominal:     General: Abdomen is flat. Bowel sounds are normal.     Palpations: Abdomen is soft.  Neurological:     Mental Status: She is alert.  Psychiatric:        Mood and Affect: Mood normal.        Behavior: Behavior normal.        Thought Content: Thought content normal.        Judgment: Judgment normal.       Assessment & Plan:  1. Dysfunctional uterine bleeding Perimenopausal vs menopausal - may not need megace any more. Will wait for now and see how bleeding is. She agrees with plan. Will send refill to pharmacy in case she starts bleeding again. If heavy and >7 days, patient to take medication and notify us. Otherwise, return in 4 months. - megestrol (MEGACE) 40 MG tablet; Take 1 tablet (40 mg total) by mouth every morning.  Dispense: 90 tablet; Refill: 1

## 2019-11-05 ENCOUNTER — Telehealth: Payer: Self-pay

## 2019-11-05 NOTE — Telephone Encounter (Signed)
Telephoned patient at home number. Left voice message with [facility] contact information. 

## 2019-12-18 ENCOUNTER — Other Ambulatory Visit: Payer: Self-pay | Admitting: Obstetrics & Gynecology

## 2019-12-18 DIAGNOSIS — Z1239 Encounter for other screening for malignant neoplasm of breast: Secondary | ICD-10-CM

## 2020-01-27 DIAGNOSIS — Z419 Encounter for procedure for purposes other than remedying health state, unspecified: Secondary | ICD-10-CM | POA: Diagnosis not present

## 2020-05-11 ENCOUNTER — Encounter: Payer: Self-pay | Admitting: Family Medicine

## 2020-05-11 ENCOUNTER — Other Ambulatory Visit: Payer: Self-pay

## 2020-05-11 ENCOUNTER — Ambulatory Visit (INDEPENDENT_AMBULATORY_CARE_PROVIDER_SITE_OTHER): Payer: Medicaid Other | Admitting: Family Medicine

## 2020-05-11 VITALS — BP 108/73 | HR 79 | Wt 162.6 lb

## 2020-05-11 DIAGNOSIS — Z780 Asymptomatic menopausal state: Secondary | ICD-10-CM | POA: Diagnosis not present

## 2020-05-11 DIAGNOSIS — N939 Abnormal uterine and vaginal bleeding, unspecified: Secondary | ICD-10-CM

## 2020-05-11 DIAGNOSIS — Z0000 Encounter for general adult medical examination without abnormal findings: Secondary | ICD-10-CM

## 2020-05-11 NOTE — Assessment & Plan Note (Signed)
No longer bleeding. Discussed that since it's been over 2 years since last EMB if she has bleeding again she should return for evaluation and repeat EMB.

## 2020-05-11 NOTE — Assessment & Plan Note (Addendum)
Due for mammogram, we will schedule this today.  Also offered flu and covid vaccines but states these are against her religion.

## 2020-05-11 NOTE — Patient Instructions (Signed)
Black Cohosh, Cimicifuga racemosa oral dosage forms What is this medicine? BLACK COHOSH (blak KOH hosh) or Cimicifuga racemosa is a dietary supplement. It is promoted to relieve symptoms of menopause, such as hot flashes. The FDA has not approved this supplement for any medical use. This supplement may be used for other purposes; ask your health care provider or pharmacist if you have questions. This medicine may be used for other purposes; ask your health care provider or pharmacist if you have questions. What should I tell my health care provider before I take this medicine? They need to know if you have any of these conditions:  breast cancer  cervical, ovarian or uterine cancer  high blood pressure  infertility  liver disease  menstrual changes or irregular periods  unusual vaginal or uterine bleeding  an unusual or allergic reaction to black cohosh, soybeans, tartrazine dye (yellow dye number 5), other medicines, foods, dyes, or preservatives  pregnant or trying to get pregnant  breast-feeding How should I use this medicine? Take this herb by mouth with a glass of water. Follow the directions on the package labeling, or talk to your health care professional. Do not use for longer than 6 months without the advice of a health care professional. Do not use if you are pregnant or breast-feeding. Talk to your obstetrician-gynecologist or certified nurse-midwife. This herb is not for use in children under the age of 20 years. Overdosage: If you think you have taken too much of this medicine contact a poison control center or emergency room at once. NOTE: This medicine is only for you. Do not share this medicine with others. What if I miss a dose? If you miss a dose, take it as soon as you can. If it is almost time for your next dose, take only that dose. Do not take double or extra doses. What may interact with this medicine?  atorvastatin  cisplatin  fertility treatments This  list may not describe all possible interactions. Give your health care provider a list of all the medicines, herbs, non-prescription drugs, or dietary supplements you use. Also tell them if you smoke, drink alcohol, or use illegal drugs. Some items may interact with your medicine. What should I watch for while using this medicine? Since this herb is derived from a plant, allergic reactions are possible. Stop using this herb if you develop a rash. You may need to see your health care professional, or inform them that this occurred. Report any unusual side effects promptly. If you are taking this herb for menstrual or menopausal symptoms, visit your doctor or health care professional for regular checks on your progress. You should have a complete check-up every 6 months. You will need a regular breast and pelvic exam while on this therapy. Follow the advice of your doctor or health care professional. Women should inform their doctor if they wish to become pregnant or think they might be pregnant. If you have any reason to think you are pregnant, stop taking this herb at once and contact your doctor or health care professional. Herbal or dietary supplements are not regulated like medicines. Rigid quality control standards are not required for dietary supplements. The purity and strength of these products can vary. The safety and effect of this dietary supplement for a certain disease or illness is not well known. This product is not intended to diagnose, treat, cure or prevent any disease. The Food and Drug Administration suggests the following to help consumers protect themselves:  Always read product labels and follow directions.  Natural does not mean a product is safe for humans to take.  Look for products that include USP after the ingredient name. This means that the manufacturer followed the standards of the U.S. Pharmacopoeia.  Supplements made or sold by a nationally known food or drug company  are more likely to be made under tight controls. You can write to the company for more information about how the product was made. What side effects may I notice from receiving this medicine? Side effects that you should report to your doctor or health care professional as soon as possible:  allergic reactions like skin rash, itching or hives, swelling of the face, lips, or tongue  breathing problems  dizziness  palpitations  signs and symptoms of liver injury like dark yellow or brown urine; general ill feeling or flu-like symptoms; light-colored stools; loss of appetite; nausea; right upper belly pain; unusually weak or tired; yellowing of the eyes or skin  unusual vaginal bleeding Side effects that usually do not require medical attention (report to your doctor or health care professional if they continue or are bothersome):  breast tenderness  headache  nausea  upset stomach This list may not describe all possible side effects. Call your doctor for medical advice about side effects. You may report side effects to FDA at 1-800-FDA-1088. Where should I keep my medicine? Keep out of the reach of children. Store at room temperature between 15 and 30 degrees C (59 and 86 degrees C). Throw away any unused herb after the expiration date. NOTE: This sheet is a summary. It may not cover all possible information. If you have questions about this medicine, talk to your doctor, pharmacist, or health care provider.  2020 Elsevier/Gold Standard (2015-11-23 14:35:09)

## 2020-05-11 NOTE — Progress Notes (Signed)
GYNECOLOGY OFFICE VISIT NOTE  History:   Jacqueline Stanley is a 49 y.o. [phone] here today for follow up of abnormal uterine bleeding.  Patient seen on 01/06/2018 by Dr. Ihor Dow, at that time following for AUB following failed endometrial ablation with normal pathology, doing well on megace Seen by Dr. Nehemiah Settle on 11/02/2019 for DUB, started on megace again with f/u in 4 months  Today reports took Megace once after last visit but not since No bleeding since June Still having hot flashes, not taking anything for them  Past Medical History:  Diagnosis Date  . Abnormal uterine bleeding (AUB)   . Uterine fibroid     Past Surgical History:  Procedure Laterality Date  . APPENDECTOMY  age 74  . DILITATION & CURRETTAGE/HYSTROSCOPY WITH NOVASURE ABLATION N/A 07/30/2017   Procedure: DILATATION & CURETTAGE/HYSTEROSCOPY WITH FAILED ENDOMETRIAL ABLATION and ENDOMETRIAL CURRETTAGE;  Surgeon: Lavonia Drafts, MD;  Location: [place];  Service: Gynecology;  Laterality: N/A;  . TUBAL LIGATION Bilateral 04-14-2000  dr [facility]els[facility]   PPTL  w/ General Anesthesia    The following portions of the patient's history were reviewed and updated as appropriate: allergies, current medications, past family history, past medical history, past social history, past surgical history and problem list.   Health Maintenance:  Normal pap and negative HRHPV: 06/06/2017.  Normal mammogram: needs.   Review of Systems:  Pertinent items noted in HPI and remainder of comprehensive ROS otherwise negative.  Physical Exam:  BP 108/73   Pulse 79   Wt 162 lb 9.6 oz (73.8 kg)   BMI 30.72 kg/m  CONSTITUTIONAL: Well-developed, well-nourished female in no acute distress.  HEENT:  Normocephalic, atraumatic. External right and left ear normal. No scleral icterus.  NECK: Normal range of motion, supple, no masses noted on observation SKIN: No rash noted. Not diaphoretic. No erythema. No  pallor. MUSCULOSKELETAL: Normal range of motion. No edema noted. NEUROLOGIC: Alert and oriented to person, place, and time. Normal muscle tone coordination.  PSYCHIATRIC: Normal mood and affect. Normal behavior. Normal judgment and thought content. RESPIRATORY: Effort normal, no problems with respiration noted PELVIC: Deferred  Labs and Imaging No results found for this or any previous visit (from the past 168 hour(s)). No results found.    Assessment and Plan:   Problem List Items Addressed This Visit      Genitourinary   Abnormal uterine bleeding (AUB) - Primary    No longer bleeding. Discussed that since it's been over 2 years since last EMB if she has bleeding again she should return for evaluation and repeat EMB.         Other   Menopause    No bleeding for six months and having hot flashes, likely entering menopause. I am hesitant to put her on estrogen therapy given history of AUB, discussed SSRI for hot flashes but she does not want to take pills. She will try black cohosh.       Routine health maintenance    Due for mammogram, we will schedule this today.  Also offered flu and covid vaccines but states these are against her religion.          Routine preventative health maintenance measures emphasized. Please refer to After Visit Summary for other counseling recommendations.   Return if symptoms worsen or fail to improve.    Total face-to-face time with patient: 20 minutes.  Over 50% of encounter was spent on counseling and coordination of care.   Annice Needy  Dione Plover, MD/[facility] [place]

## 2020-05-11 NOTE — Assessment & Plan Note (Signed)
No bleeding for six months and having hot flashes, likely entering menopause. I am hesitant to put her on estrogen therapy given history of AUB, discussed SSRI for hot flashes but she does not want to take pills. She will try black cohosh.

## 2020-06-21 ENCOUNTER — Ambulatory Visit: Payer: Medicaid Other

## 2021-04-19 DIAGNOSIS — R635 Abnormal weight gain: Secondary | ICD-10-CM | POA: Diagnosis not present

## 2021-04-19 DIAGNOSIS — E6609 Other obesity due to excess calories: Secondary | ICD-10-CM | POA: Diagnosis not present

## 2021-04-19 DIAGNOSIS — Z6833 Body mass index (BMI) 33.0-33.9, adult: Secondary | ICD-10-CM | POA: Diagnosis not present

## 2021-04-19 DIAGNOSIS — Z87891 Personal history of nicotine dependence: Secondary | ICD-10-CM | POA: Diagnosis not present

## 2021-12-01 ENCOUNTER — Encounter: Payer: Self-pay | Admitting: Family Medicine

## 2021-12-01 ENCOUNTER — Ambulatory Visit (INDEPENDENT_AMBULATORY_CARE_PROVIDER_SITE_OTHER): Payer: Medicaid Other | Admitting: Family Medicine

## 2021-12-01 VITALS — BP 128/84 | HR 84 | Ht 60.0 in | Wt 172.2 lb

## 2021-12-01 DIAGNOSIS — R42 Dizziness and giddiness: Secondary | ICD-10-CM

## 2021-12-01 DIAGNOSIS — Z0000 Encounter for general adult medical examination without abnormal findings: Secondary | ICD-10-CM

## 2021-12-01 DIAGNOSIS — Z1211 Encounter for screening for malignant neoplasm of colon: Secondary | ICD-10-CM

## 2021-12-01 DIAGNOSIS — M79672 Pain in left foot: Secondary | ICD-10-CM | POA: Diagnosis present

## 2021-12-01 MED ORDER — DICLOFENAC SODIUM 1 % EX GEL: 2.0000 g | Freq: Four times a day (QID) | CUTANEOUS | 0 refills | Status: AC

## 2021-12-01 NOTE — Assessment & Plan Note (Signed)
Present for about 1 month.  Possibly in the setting of tripping injury months prior.  Consistent with Achilles tendinitis/irritation.  Do not feel that imaging is warranted at this time - Conservative management with Voltaren gel, OTC NSAIDs, heating pad. - Gentle stretching exercises in the morning - Follow-up in 2 to 4 weeks if no improvement - Can consider physical therapy

## 2021-12-01 NOTE — Progress Notes (Signed)
Subjective:    Patient ID: Jacqueline Stanley, female    DOB: 04/21/1971, 51 y.o.   MRN: [id number]   CC: New Patient  HPI:  Left heel pain: - 4-5 months ago tripped over carpet - 3 months later had pain in her heel - Hurts worse with sleep and getting up in the morning - Sharp and localized, eases up with walking - Had to take off 1 week due to the pain  Dizziness  - Initially occurred when she was having issues with her uterine bleeding and had been on iron pills and Megace at that time - Mainly occurs in bed when turning her head - Has been present for a few years - Has had relief from the symptoms intermittently - Does not affect her when she is going from sitting to standing   PMHx: Past Medical History:  Diagnosis Date   Abnormal uterine bleeding (AUB)    Dysfunctional uterine bleeding 05/30/2017   Uterine fibroid      Surgical Hx: Past Surgical History:  Procedure Laterality Date   APPENDECTOMY  age 54   DILITATION & CURRETTAGE/HYSTROSCOPY WITH NOVASURE ABLATION N/A 07/30/2017   Procedure: DILATATION & CURETTAGE/HYSTEROSCOPY WITH FAILED ENDOMETRIAL ABLATION and ENDOMETRIAL CURRETTAGE;  Surgeon: Lavonia Drafts, MD;  Location: [place];  Service: Gynecology;  Laterality: N/A;   TUBAL LIGATION Bilateral 04-14-2000  dr Delsa Sale  [facility]   PPTL  w/ General Anesthesia     Family Hx: History reviewed. No pertinent family history. Maternal breast cancer. Double mastectomy, still living  Social Hx: Current Social History 12/01/2021   Who lives at home: daughter, son (65yo)  Who would speak for you about health care matters: Oldest daughter or mother  Living will or Advance Directive: No Transportation: Drives herself  Important Relationships & Pets: kitten 77 year old  Current Stressors: None  Work / Education:  part-time custodian at [employer] / Personal Beliefs: [employer] not affecting healthcare  Tobacco: none  currently. Previously smoked 8 years in the 54s. Alcohol: previously, none currently Drugs: Denies    Medications: None  LMP Stopped at age 14, hasn't had any abnormal bleeding since then  Preventative Screening Colonoscopy: Never done Mammogram:previously done Pap test: 2019 NILM with negative HPV Tetanus vaccine: Previously done, unclear what date Shingles vaccine: Never done  Objective:  BP 128/84   Pulse 84   Ht 5' (1.524 m)   Wt 172 lb 4 oz (78.1 kg)   SpO2 99%   BMI 33.64 kg/m  Vitals and nursing note reviewed  General: well nourished, in no acute distress HEENT: normocephalic, no scleral icterus or conjunctival pallor, no nasal discharge, moist mucous membranes, good dentition without erythema or discharge noted in posterior oropharynx Cardiac: RRR, clear S1 and S2, no murmurs, rubs, or gallops Respiratory: clear to auscultation bilaterally, no increased work of breathing Extremities: no edema or cyanosis. Warm, well perfused. 2+ radial and PT pulses bilaterally.  Left Achilles insertion point with tenderness to palpation, full ROM of the left foot/ankle with no pain.  Patient is able to bear weight appropriately on left foot. Neuro: alert and oriented, no focal deficits.  Dizziness reproducible upon patient laying down and turning head to the left and right, no nystagmus elicited.   Assessment & Plan:    Pain of left heel Present for about 1 month.  Possibly in the setting of tripping injury months prior.  Consistent with Achilles tendinitis/irritation.  Do not feel that imaging is warranted  at this time - Conservative management with Voltaren gel, OTC NSAIDs, heating pad. - Gentle stretching exercises in the morning - Follow-up in 2 to 4 weeks if no improvement - Can consider physical therapy  Dizziness At this time, seems consistent with BPPV.  Differential does include orthostatic hypotension, anemia (given known history), autonomic dysfunction (unlikely). -  Patient given information to perform Epley maneuver - Consider vestibular rehab if persistent - If persistent in 2 to 4 weeks, consider CBC and orthostatic vitals  Routine health maintenance - GI referral for colonoscopy placed today - Mammogram information given for scheduling - Pap smear due in 2024 - Patient reports tetanus vaccine in the last few years, awaiting documentation - Hepatitis C screening with next blood draw    Return in about 4 weeks (around 12/29/2021) for Dizziness and foot pain follow-up.   Rise Patience, DO, PGY-3

## 2021-12-01 NOTE — Assessment & Plan Note (Signed)
-   GI referral for colonoscopy placed today - Mammogram information given for scheduling - Pap smear due in 2024 - Patient reports tetanus vaccine in the last few years, awaiting documentation - Hepatitis C screening with next blood draw

## 2021-12-01 NOTE — Assessment & Plan Note (Signed)
At this time, seems consistent with BPPV.  Differential does include orthostatic hypotension, anemia (given known history), autonomic dysfunction (unlikely). - Patient given information to perform Epley maneuver - Consider vestibular rehab if persistent - If persistent in 2 to 4 weeks, consider CBC and orthostatic vitals

## 2021-12-01 NOTE — Patient Instructions (Addendum)
It was so great seeing you today! Today we discussed the following:  -Left heel pain: I have sent in Voltaren gel that I want you to use, you can also use heat in the morning before you get out of bed on the area and see if that helps.  I recommend doing slow gentle stretches with your foot in all ranges of motion/directions before starting the day.  Use ibuprofen and Tylenol as needed.  If no improvement over the next 2 to 4 weeks, let me know and we will try other treatments.  -For the dizziness, there can be several things that cause this. One of which is called benign paroxysmal positional vertigo and I have given you a handout on a maneuver you can try to help this.  If it does not improve in the next couple weeks, let me know.  -A referral to the GI doctor as needed for the colonoscopy.  Please make sure to also get your mammogram done  Please make sure to bring any medications you take to your appointments. If you have any questions or concerns please call the office at [phone].

## 2022-07-13 ENCOUNTER — Ambulatory Visit (INDEPENDENT_AMBULATORY_CARE_PROVIDER_SITE_OTHER): Payer: Medicaid Other | Admitting: Family Medicine

## 2022-07-13 ENCOUNTER — Encounter: Payer: Self-pay | Admitting: Family Medicine

## 2022-07-13 VITALS — BP 134/87 | HR 77 | Ht 60.0 in | Wt 173.1 lb

## 2022-07-13 DIAGNOSIS — Z1211 Encounter for screening for malignant neoplasm of colon: Secondary | ICD-10-CM

## 2022-07-13 DIAGNOSIS — Z0000 Encounter for general adult medical examination without abnormal findings: Secondary | ICD-10-CM

## 2022-07-13 DIAGNOSIS — M79672 Pain in left foot: Secondary | ICD-10-CM

## 2022-07-13 NOTE — Patient Instructions (Addendum)
You are due for the following tests for Healthcare maintenance: - Hepatitis C screening (you will do your next blood draw) - Mammogram (please call the breast center to get this scheduled) - Colonoscopy, I have sent in an order for FIT testing which will be sent to your home to do instead of the colonoscopy - Pap smear, schedule whenever you are available  For your foot pain - Continue to use Voltaren gel as needed - You can use Tylenol or ibuprofen over-the-counter, I do not recommend using Goody powders as they are higher doses of the medications - We will trial out the exercises attached for the next 4 to 8 weeks to see if there is any improvement

## 2022-07-13 NOTE — Assessment & Plan Note (Signed)
-   Patient given information to call breast center - FIT testing ordered, patient wants this instead of colonoscopy - Hepatitis C screening with next lab draw - Pap smear at next visit

## 2022-07-13 NOTE — Assessment & Plan Note (Signed)
Still present on examination this year without changes.  Patient did not do the physical exercises discussed and is not taking any OTC medications other than the Voltaren gel, which does help the pain. - Discussed conservative treatment with OTC Tylenol and ibuprofen - Continue use Voltaren gel as needed - Home exercises for Achilles tendinitis given to patient to trial - Follow-up in 2 months if symptoms persist, consider formal PT at that time

## 2022-07-13 NOTE — Progress Notes (Signed)
    SUBJECTIVE:   CHIEF COMPLAINT / HPI:   Left heel pain - Mentioned at last visit - Has been present for almost 1 year at this point - Mostly painful when laying down or when going to standing position after sitting for quite some time - Somewhat affecting her ability to work but not stopping her from being able to work entirely - Uses Voltaren gel sometimes - Is currently not using Tylenol or ibuprofen, does report using Goody powders - Has not done any of the exercises previously discussed  Healthcare maintenance - Patient has not yet scheduled mammogram - Patient wants to do home testing before doing colonoscopy - Patient's last Pap smear was in 2019   PERTINENT  PMH / PSH: Reviewed  OBJECTIVE:   BP 134/87   Pulse 77   Ht 5' (1.524 m)   Wt 173 lb 2 oz (78.5 kg)   SpO2 100%   BMI 33.81 kg/m   General: NAD, well-appearing, well-nourished Respiratory: No respiratory distress, breathing comfortably, able to speak in full sentences Skin: warm and dry, no rashes noted on exposed skin Psych: Appropriate affect and mood  ASSESSMENT/PLAN:   Pain of left heel Still present on examination this year without changes.  Patient did not do the physical exercises discussed and is not taking any OTC medications other than the Voltaren gel, which does help the pain. - Discussed conservative treatment with OTC Tylenol and ibuprofen - Continue use Voltaren gel as needed - Home exercises for Achilles tendinitis given to patient to trial - Follow-up in 2 months if symptoms persist, consider formal PT at that time  Routine health maintenance - Patient given information to call breast center - FIT testing ordered, patient wants this instead of colonoscopy - Hepatitis C screening with next lab draw - Pap smear at next visit     Rise Patience, [place]

## 2022-09-12 ENCOUNTER — Ambulatory Visit (INDEPENDENT_AMBULATORY_CARE_PROVIDER_SITE_OTHER): Payer: Medicaid Other | Admitting: Podiatry

## 2022-09-12 ENCOUNTER — Ambulatory Visit (INDEPENDENT_AMBULATORY_CARE_PROVIDER_SITE_OTHER): Payer: Medicaid Other

## 2022-09-12 DIAGNOSIS — M79672 Pain in left foot: Secondary | ICD-10-CM

## 2022-09-12 DIAGNOSIS — M7662 Achilles tendinitis, left leg: Secondary | ICD-10-CM

## 2022-09-12 MED ORDER — BETAMETHASONE SOD PHOS & ACET 6 (3-3) MG/ML IJ SUSP
3.0000 mg | Freq: Once | INTRAMUSCULAR | Status: AC
  Administered 2022-09-12: 3 mg via INTRA_ARTICULAR

## 2022-09-12 MED ORDER — METHYLPREDNISOLONE 4 MG PO TBPK: ORAL_TABLET | ORAL | 0 refills | Status: DC

## 2022-09-12 MED ORDER — MELOXICAM 15 MG PO TABS: 15.0000 mg | ORAL_TABLET | Freq: Every day | ORAL | 1 refills | Status: DC

## 2022-09-12 NOTE — Progress Notes (Signed)
   Chief Complaint  Patient presents with   Foot Pain    Left foot  heel pain, rate of pain, 10 out of 10, throbbing, after sitiing for a long time, X-Rays done today     HPI: 52 y.o. female presenting today as a new patient for evaluation of pain and tenderness associated to left posterior heel about 2 years now.  Initially began when she sustained a fall injury and injured the posterior aspect of her heel.  It is been painful and tender ever since.  She has tried topical diclofenac with no improvement.  She presents for further treatment evaluation  Past Medical History:  Diagnosis Date   Abnormal uterine bleeding (AUB)    Dysfunctional uterine bleeding 05/30/2017   Uterine fibroid     Past Surgical History:  Procedure Laterality Date   APPENDECTOMY  age 17   DILITATION & CURRETTAGE/HYSTROSCOPY WITH NOVASURE ABLATION N/A 07/30/2017   Procedure: DILATATION & CURETTAGE/HYSTEROSCOPY WITH FAILED ENDOMETRIAL ABLATION and ENDOMETRIAL CURRETTAGE;  Surgeon: Willodean Rosenthal, MD;  Location: [facility];  Service: Gynecology;  Laterality: N/A;   TUBAL LIGATION Bilateral 04-14-2000  dr Tamela Oddi  [facility]   PPTL  w/ General Anesthesia    No Known Allergies   Physical Exam: General: The patient is alert and oriented x3 in no acute distress.  Dermatology: Skin is warm, dry and supple bilateral lower extremities. Negative for open lesions or macerations.  Vascular: Skin warm to touch.  Capillary refill immediate.  Neurological: Epicritic and protective threshold grossly intact bilaterally.   Musculoskeletal Exam: Pain on palpation noted to the posterior tubercle of the left calcaneus at the insertion of the Achilles tendon consistent with retrocalcaneal bursitis. Range of motion within normal limits. Muscle strength 5/5 in all muscle groups bilateral lower extremities.  Radiographic Exam LT foot 09/12/2022:  Posterior calcaneal spur noted to the respective calcaneus on  lateral view with detachment possibly secondary to fracture of the spur at the time of injury.  Calcifications also noted throughout the leg  Assessment: 1. Insertional Achilles tendinitis left 2.  Posterior heel spur left  Plan of Care:  1. Patient was evaluated. Radiographs were reviewed today. 2. Injection of 0.5 mL Celestone Soluspan injected into the retrocalcaneal bursa. Care was taken to avoid direct injection into the Achilles tendon. 3.  Prescription for Medrol Dosepak 4.  Prescription for meloxicam 15 mg daily after completion of the Dosepak 5.  Cam boot dispensed.  WBAT.  This will support the Achilles tendon and immobilize it to allow it to heal and decrease chance of rupture 6.  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 Felecia Shelling, DPM    [address] [place], [place] [address]                Office [phone]  Fax [phone]

## 2022-09-24 ENCOUNTER — Other Ambulatory Visit: Payer: Self-pay | Admitting: Podiatry

## 2022-09-24 DIAGNOSIS — M79672 Pain in left foot: Secondary | ICD-10-CM

## 2022-09-24 DIAGNOSIS — M7662 Achilles tendinitis, left leg: Secondary | ICD-10-CM

## 2022-10-11 ENCOUNTER — Encounter: Payer: Self-pay | Admitting: Family Medicine

## 2022-10-11 ENCOUNTER — Ambulatory Visit (INDEPENDENT_AMBULATORY_CARE_PROVIDER_SITE_OTHER): Payer: Medicaid Other | Admitting: Family Medicine

## 2022-10-11 ENCOUNTER — Other Ambulatory Visit (HOSPITAL_COMMUNITY)
Admission: RE | Admit: 2022-10-11 | Discharge: 2022-10-11 | Disposition: A | Discharge status: Home / Self Care | Payer: Medicaid Other | Source: Ambulatory Visit | Attending: Family Medicine | Admitting: Family Medicine

## 2022-10-11 VITALS — BP 137/75 | HR 71 | Ht 60.0 in | Wt 185.1 lb

## 2022-10-11 DIAGNOSIS — Z124 Encounter for screening for malignant neoplasm of cervix: Secondary | ICD-10-CM

## 2022-10-11 DIAGNOSIS — Z01419 Encounter for gynecological examination (general) (routine) without abnormal findings: Secondary | ICD-10-CM

## 2022-10-11 NOTE — Patient Instructions (Signed)
Your pap smear will take a few days to come back and I will let you know of the result

## 2022-10-11 NOTE — Progress Notes (Signed)
    SUBJECTIVE:   CHIEF COMPLAINT / HPI:   Pap smear - Patient due for pap smear/cervical cancer screening - Denies vaginal discharge or abnormal bleeding - Last pap smear completed in 2019  PERTINENT  PMH / PSH: Reviewed  OBJECTIVE:   BP 137/75   Pulse 71   Ht 5' (1.524 m)   Wt 185 lb 2 oz (84 kg)   SpO2 100%   BMI 36.15 kg/m   General: NAD, well-appearing, well-nourished Respiratory: No respiratory distress, breathing comfortably, able to speak in full sentences Skin: warm and dry, no rashes noted on exposed skin Psych: Appropriate affect and mood Pelvic exam: normal external genitalia, vulva, vagina, cervix, uterus and adnexa, PAP: Pap smear done today, exam chaperoned by Aquilla Solian, CMA.  ASSESSMENT/PLAN:   Well Woman Exam - Pap smear with HPV testing completed today  Healthcare maintenance - Patient due for colonoscopy, previously discussed - Patient due for mammogram, previously discussed    Evelena Leyden, DO [facility]

## 2022-10-15 ENCOUNTER — Ambulatory Visit: Payer: Medicaid Other | Admitting: Podiatry

## 2022-10-17 ENCOUNTER — Ambulatory Visit (INDEPENDENT_AMBULATORY_CARE_PROVIDER_SITE_OTHER): Payer: Medicaid Other | Admitting: Podiatry

## 2022-10-17 DIAGNOSIS — M7662 Achilles tendinitis, left leg: Secondary | ICD-10-CM | POA: Diagnosis not present

## 2022-10-17 LAB — CYTOLOGY - PAP
Comment: NEGATIVE
Diagnosis: REACTIVE — AB
Diagnosis: UNDETERMINED — AB
High risk HPV: NEGATIVE

## 2022-10-17 NOTE — Progress Notes (Signed)
   Chief Complaint  Patient presents with   Foot Pain    HPI: 52 y.o. female presenting today as a new patient for evaluation of pain and tenderness associated to left posterior heel about 2 years now.  Initially began when she sustained a fall injury and injured the posterior aspect of her heel.  It is been painful and tender ever since.   Patient states that since last visit she has had no pain or tenderness associated to the Achilles tendon of the left lower extremity.  She is very satisfied.  She is no longer wearing the cam boot.  She has also began to discontinue the meloxicam.  She is very relieved that she no longer has any pain or tenderness that is been ongoing now for about 2 years.  Past Medical History:  Diagnosis Date   Abnormal uterine bleeding (AUB)    Dysfunctional uterine bleeding 05/30/2017   Uterine fibroid     Past Surgical History:  Procedure Laterality Date   APPENDECTOMY  age 34   DILITATION & CURRETTAGE/HYSTROSCOPY WITH NOVASURE ABLATION N/A 07/30/2017   Procedure: DILATATION & CURETTAGE/HYSTEROSCOPY WITH FAILED ENDOMETRIAL ABLATION and ENDOMETRIAL CURRETTAGE;  Surgeon: Willodean Rosenthal, MD;  Location: [facility];  Service: Gynecology;  Laterality: N/A;   TUBAL LIGATION Bilateral 04-14-2000  dr Tamela Oddi  [facility]   PPTL  w/ General Anesthesia    No Known Allergies   Physical Exam: General: The patient is alert and oriented x3 in no acute distress.  Dermatology: Skin is warm, dry and supple bilateral lower extremities. Negative for open lesions or macerations.  Vascular: Skin warm to touch.  Capillary refill immediate.  Neurological: Grossly intact via light touch  Musculoskeletal Exam: Pain appears to be resolved.  No pain on palpation noted to the posterior tubercle of the left calcaneus at the insertion of the Achilles tendon. Range of motion within normal limits. Muscle strength 5/5 in all muscle groups bilateral lower  extremities.  Radiographic Exam LT foot 09/12/2022:  Posterior calcaneal spur noted to the respective calcaneus on lateral view with detachment possibly secondary to fracture of the spur at the time of injury.  Calcifications also noted throughout the leg  Assessment: 1. Insertional Achilles tendinitis left 2.  Posterior heel spur left  Plan of Care:  1. Patient was evaluated.  Overall the patient is very satisfied no longer has any pain or tenderness 2.  Continue daily stretching exercises 3.  Continue meloxicam 15 mg daily as needed 4.  Recommend good supportive tennis shoes and sneakers. 5.  Return to clinic as needed  *Lawyer at [employer], [facility]   Felecia Shelling, [place] [facility]  Dr. Felecia Shelling, DPM    [address] [place], [place] [address]                Office [phone]  Fax [phone]

## 2022-12-19 ENCOUNTER — Other Ambulatory Visit: Payer: Self-pay | Admitting: Podiatry

## 2023-03-18 ENCOUNTER — Ambulatory Visit (INDEPENDENT_AMBULATORY_CARE_PROVIDER_SITE_OTHER): Payer: Medicaid Other | Admitting: Podiatry

## 2023-03-18 ENCOUNTER — Encounter: Payer: Self-pay | Admitting: Podiatry

## 2023-03-18 DIAGNOSIS — M7662 Achilles tendinitis, left leg: Secondary | ICD-10-CM

## 2023-03-18 MED ORDER — MELOXICAM 15 MG PO TABS: 15.0000 mg | ORAL_TABLET | Freq: Every day | ORAL | 1 refills | Status: DC

## 2023-03-18 MED ORDER — METHYLPREDNISOLONE 4 MG PO TBPK: ORAL_TABLET | ORAL | 0 refills | Status: DC

## 2023-03-18 MED ORDER — BETAMETHASONE SOD PHOS & ACET 6 (3-3) MG/ML IJ SUSP
3.0000 mg | Freq: Once | INTRAMUSCULAR | Status: AC
  Administered 2023-03-18: 3 mg via INTRA_ARTICULAR

## 2023-03-18 NOTE — Progress Notes (Signed)
   Chief Complaint  Patient presents with   Foot Pain    PATIENT STATES THAT THE BACK OF HER LF IS PAIN FULL , STILL TAKING MEDICATION THAT WAS PRESCRIBED. PATIENT STATES SHE HAS BEEN USING OVER THE COUNTER FUNGUS MEDICATION AND BUT FUNGUS IS STILL THERE JUST A LITTLE BIT    HPI: 52 y.o. female presenting today for follow-up evaluation of left posterior heel pain ongoing for over 2 years now.  Patient states that the last injections earlier this year helped significantly as well as oral anti-inflammatory medication.  She is currently not taking anything.  Gradual recurrence of the pain over the last few months  Past Medical History:  Diagnosis Date   Abnormal uterine bleeding (AUB)    Dysfunctional uterine bleeding 05/30/2017   Uterine fibroid     Past Surgical History:  Procedure Laterality Date   APPENDECTOMY  age 87   DILITATION & CURRETTAGE/HYSTROSCOPY WITH NOVASURE ABLATION N/A 07/30/2017   Procedure: DILATATION & CURETTAGE/HYSTEROSCOPY WITH FAILED ENDOMETRIAL ABLATION and ENDOMETRIAL CURRETTAGE;  Surgeon: Willodean Rosenthal, MD;  Location: [facility];  Service: Gynecology;  Laterality: N/A;   TUBAL LIGATION Bilateral 04-14-2000  dr Tamela Oddi  [facility]   PPTL  w/ General Anesthesia    No Known Allergies   Physical Exam: General: The patient is alert and oriented x3 in no acute distress.  Dermatology: Skin is warm, dry and supple bilateral lower extremities. Negative for open lesions or macerations.  Vascular: Skin warm to touch.  Capillary refill immediate.  Neurological: Grossly intact via light touch  Musculoskeletal Exam: pain on palpation noted to the posterior tubercle of the left calcaneus at the insertion of the Achilles tendon. Range of motion within normal limits. Muscle strength 5/5 in all muscle groups bilateral lower extremities.  Radiographic Exam LT foot 09/12/2022:  Posterior calcaneal spur noted to the respective calcaneus on lateral view with  detachment possibly secondary to fracture of the spur at the time of injury.  Calcifications also noted throughout the leg  Assessment: 1. Insertional Achilles tendinitis left 2.  Posterior heel spur left  Plan of Care:  -Patient evaluated. -Injection of 0.5 cc Celestone Soluspan injected around the posterior aspect of the left heel.  Care taken to avoid injection into the Achilles tendon -Prescription for Medrol Dosepak -Prescription for meloxicam 15 mg daily -Continue wearing good supportive shoes and sneakers -Ultimately I did recommend surgery with the patient to remove the posterior heel spur and potentially alleviate her symptoms permanently.  Although she is apprehensive about surgery.  If she did pursue surgery it would be during the summer. -Return to clinic as needed  *Lawyer at [employer], [facility]   Felecia Shelling, [place] [facility]  Dr. Felecia Shelling, DPM    [address] [place], [place] [address]                Office [phone]  Fax [phone]

## 2023-08-21 ENCOUNTER — Other Ambulatory Visit: Payer: Self-pay | Admitting: Podiatry

## 2024-01-19 ENCOUNTER — Other Ambulatory Visit: Payer: Self-pay

## 2024-01-19 ENCOUNTER — Encounter (HOSPITAL_COMMUNITY): Payer: Self-pay

## 2024-01-19 ENCOUNTER — Emergency Department (HOSPITAL_COMMUNITY)
Admission: EM | Admit: 2024-01-19 | Discharge: 2024-01-19 | Disposition: A | Discharge status: Home / Self Care | Attending: Emergency Medicine | Admitting: Emergency Medicine

## 2024-01-19 DIAGNOSIS — I10 Essential (primary) hypertension: Secondary | ICD-10-CM | POA: Diagnosis not present

## 2024-01-19 DIAGNOSIS — W44F3XA Food entering into or through a natural orifice, initial encounter: Secondary | ICD-10-CM | POA: Insufficient documentation

## 2024-01-19 DIAGNOSIS — T189XXA Foreign body of alimentary tract, part unspecified, initial encounter: Secondary | ICD-10-CM | POA: Insufficient documentation

## 2024-01-19 MED ORDER — DIAZEPAM 5 MG/ML IJ SOLN: 2.5000 mg | Freq: Once | INTRAMUSCULAR | Status: DC

## 2024-01-19 MED ORDER — GLUCAGON HCL RDNA (DIAGNOSTIC) 1 MG IJ SOLR: 1.0000 mg | Freq: Once | INTRAMUSCULAR | Status: DC

## 2024-01-19 MED ORDER — ONDANSETRON 4 MG PO TBDP
4.0000 mg | ORAL_TABLET | Freq: Once | ORAL | Status: AC
  Administered 2024-01-19: 4 mg via ORAL
  Filled 2024-01-19: qty 1

## 2024-01-19 NOTE — ED Provider Notes (Signed)
 [facility] Provider Note   CSN: [id number] Arrival date & time: 01/19/24  1807     Patient presents with: Swallowed Foreign Body   Jacqueline Stanley is a 53 y.o. female here with complaint of foreign body stuck in throat.  Patient reports that a piece of steak that she was eating her 530 feels acute got stuck in her throat.  She cannot swallow water   HPI     Prior to Admission medications   Medication Sig Start Date End Date Taking? Authorizing Provider  diclofenac  Sodium (VOLTAREN ) 1 % GEL Apply 2 g topically 4 (four) times daily. 12/01/21   Lilland, Alana, DO  meloxicam  (MOBIC ) 15 MG tablet TAKE 1 TABLET(15 MG) BY MOUTH DAILY 08/21/23   Janit Thresa HERO, DPM  methylPREDNISolone  (MEDROL  DOSEPAK) 4 MG TBPK tablet 6 day dose pack - take as directed 03/18/23   Evans, Brent M, DPM    Allergies: Patient has no known allergies.    Review of Systems  Updated Vital Signs BP (!) 186/91 (BP Location: Right Arm)   Pulse 87   Temp 97.8 F (36.6 C) (Oral)   Resp 18   Ht 5' (1.524 m)   Wt 72.6 kg   SpO2 100%   BMI 31.25 kg/m   Physical Exam Constitutional:      General: She is not in acute distress. HENT:     Head: Normocephalic and atraumatic.  Eyes:     Conjunctiva/sclera: Conjunctivae normal.     Pupils: Pupils are equal, round, and reactive to light.  Cardiovascular:     Rate and Rhythm: Normal rate and regular rhythm.  Pulmonary:     Effort: Pulmonary effort is normal. No respiratory distress.  Abdominal:     General: There is no distension.     Tenderness: There is no abdominal tenderness.  Skin:    General: Skin is warm and dry.  Neurological:     General: No focal deficit present.     Mental Status: She is alert. Mental status is at baseline.  Psychiatric:        Mood and Affect: Mood normal.        Behavior: Behavior normal.     (all labs ordered are listed, but only abnormal results are displayed) Labs Reviewed  - No data to display  EKG: None  Radiology: No results found.   Procedures   Medications Ordered in the ED  ondansetron  (ZOFRAN -ODT) disintegrating tablet 4 mg (4 mg Oral Given 01/19/24 1845)    Clinical Course as of 01/19/24 1928  Sun Jan 19, 2024  1841 Pt had large volume vomiting - feels that the foreign body dislodged.  We'll try oral zofran  and PO challenge [MT]    Clinical Course User Index [MT] Elayjah Chaney, Donnice PARAS, MD                                 Medical Decision Making Risk Prescription drug management.   Pt here with steak stuck in esophagus.  Tolerating secretions and speaking clearly.  She vomited after getting into the room and steak came up.  Afterwards she was observed and drank fluids easily, foreign body feeling is gone. Zofran  given for nausea.  She is stable for discharge.  No indication for xray imaging. Doubt aspiration pneumonia.  BP elevated likely related to discomfort - can f/u with PCP for that  Final diagnoses:  Swallowed foreign body, initial encounter  Hypertension, unspecified type    ED Discharge Orders     None          Vanisha Whiten, Donnice PARAS, MD 01/19/24 [phone]

## 2024-01-19 NOTE — ED Triage Notes (Signed)
 Pt was eating steak around 530 and thinks she got some stuck. Pt tried swallowing water and ginger ale but no relief. Denies vomiting or trouble breathing

## 2024-01-19 NOTE — ED Notes (Signed)
 Po challenged started on the patient.

## 2024-02-13 ENCOUNTER — Other Ambulatory Visit: Payer: Self-pay | Admitting: Podiatry

## 2024-03-09 ENCOUNTER — Encounter: Payer: Self-pay | Admitting: Family Medicine

## 2024-03-09 ENCOUNTER — Ambulatory Visit (INDEPENDENT_AMBULATORY_CARE_PROVIDER_SITE_OTHER): Admitting: Family Medicine

## 2024-03-09 VITALS — BP 138/88 | HR 94 | Ht 60.0 in | Wt 180.0 lb

## 2024-03-09 DIAGNOSIS — M542 Cervicalgia: Secondary | ICD-10-CM

## 2024-03-09 DIAGNOSIS — M25519 Pain in unspecified shoulder: Secondary | ICD-10-CM | POA: Diagnosis not present

## 2024-03-09 MED ORDER — TIZANIDINE HCL 4 MG PO TABS: 4.0000 mg | ORAL_TABLET | Freq: Three times a day (TID) | ORAL | 0 refills | Status: AC | PRN
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
## 2024-03-16 ENCOUNTER — Ambulatory Visit: Admitting: Family Medicine

## 2024-05-11 ENCOUNTER — Ambulatory Visit: Admitting: Podiatry

## 2024-05-11 ENCOUNTER — Encounter: Payer: Self-pay | Admitting: Podiatry

## 2024-05-11 ENCOUNTER — Ambulatory Visit

## 2024-05-11 VITALS — Ht 60.0 in | Wt 180.0 lb

## 2024-05-11 DIAGNOSIS — M7662 Achilles tendinitis, left leg: Secondary | ICD-10-CM

## 2024-05-11 DIAGNOSIS — M7732 Calcaneal spur, left foot: Secondary | ICD-10-CM | POA: Diagnosis not present

## 2024-05-11 MED ORDER — METHYLPREDNISOLONE 4 MG PO TBPK: ORAL_TABLET | ORAL | 0 refills | Status: AC

## 2024-05-11 MED ORDER — MELOXICAM 15 MG PO TABS: 15.0000 mg | ORAL_TABLET | Freq: Every day | ORAL | 1 refills | Status: AC

## 2024-05-11 MED ORDER — BETAMETHASONE SOD PHOS & ACET 6 (3-3) MG/ML IJ SUSP
3.0000 mg | Freq: Once | INTRAMUSCULAR | Status: AC
  Administered 2024-05-11: 16:00:00 3 mg via INTRA_ARTICULAR

## 2024-05-11 NOTE — Progress Notes (Unsigned)
° °  Chief Complaint  Patient presents with   Foot Pain    Pt is here to f/u on left foot due to pain, states the pain is at the heel of the foot, states she has had this pain for over a year, states the pain meds were not helping recently ran out.    HPI: 53 y.o. female presenting today for follow-up evaluation of left posterior heel pain ongoing for over 2 years now.  She continues to have chronic pain associated to the posterior aspect of the left ankle  Past Medical History:  Diagnosis Date   Abnormal uterine bleeding (AUB)    Dysfunctional uterine bleeding 05/30/2017   Uterine fibroid     Past Surgical History:  Procedure Laterality Date   APPENDECTOMY  age 32   DILITATION & CURRETTAGE/HYSTROSCOPY WITH NOVASURE ABLATION N/A 07/30/2017   Procedure: DILATATION & CURETTAGE/HYSTEROSCOPY WITH FAILED ENDOMETRIAL ABLATION and ENDOMETRIAL CURRETTAGE;  Surgeon: Corene Coy, MD;  Location: [facility];  Service: Gynecology;  Laterality: N/A;   TUBAL LIGATION Bilateral 04-14-2000  dr rogelio  [facility]   PPTL  w/ General Anesthesia    No Known Allergies   Physical Exam: General: The patient is alert and oriented x3 in no acute distress.  Dermatology: Skin is warm, dry and supple bilateral lower extremities. Negative for open lesions or macerations.  Vascular: Skin warm to touch.  Capillary refill immediate.  Neurological: Grossly intact via light touch  Musculoskeletal Exam: pain on palpation noted to the posterior tubercle of the left calcaneus at the insertion of the Achilles tendon. Range of motion within normal limits. Muscle strength 5/5 in all muscle groups bilateral lower extremities.  Radiographic Exam LT foot 05/11/2024:  Large posterior calcaneal spur noted to the respective calcaneus on lateral view with detachment possibly secondary to fracture of the spur at the time of injury.  Calcifications also noted throughout the leg  Assessment: 1.  Insertional Achilles tendinitis left 2.  Posterior heel spur left  Plan of Care:  -Patient evaluated.  Treatments in the past have helped temporarily alleviate the patient's symptoms and pain.  We did discuss surgery however she is not currently in a position to pursue surgery at this time.  We will continue the same treatment regimen that has helped in the past -Injection of 0.5 cc Celestone  Soluspan injected around the posterior aspect of the left heel.  Care taken to avoid injection into the Achilles tendon -Prescription for Medrol  Dosepak -Prescription for meloxicam  15 mg daily -Continue wearing good supportive shoes and sneakers -Ultimately I do recommend surgery to remove the posterior heel spur and potentially alleviate her symptoms permanently.  Although she is apprehensive about surgery.  If she did pursue surgery it would be during the summer. -Return to clinic as needed  *Lawyer at [employer], [facility]   Thresa EMERSON Sar, [place] [facility]  Dr. Thresa EMERSON Sar, DPM    [address] [place], [place] [address]                Office [phone]  Fax [phone]

## 2024-06-22 ENCOUNTER — Ambulatory Visit: Admitting: Podiatry

## 2024-06-29 ENCOUNTER — Ambulatory Visit: Admitting: Podiatry

## 2024-07-06 ENCOUNTER — Ambulatory Visit: Admitting: Podiatry
